# Patient Record
Sex: Female | Born: 2000
Health system: Southern US, Community
[De-identification: ages and names within clinical notes are randomized; demographics above are authoritative.]

## PROBLEM LIST (undated history)

## (undated) ENCOUNTER — Emergency Department (HOSPITAL_COMMUNITY): Admission: EM | Payer: Medicaid Other | Source: Home / Self Care

## (undated) DIAGNOSIS — T7840XA Allergy, unspecified, initial encounter: Secondary | ICD-10-CM

## (undated) DIAGNOSIS — D509 Iron deficiency anemia, unspecified: Secondary | ICD-10-CM

## (undated) DIAGNOSIS — O139 Gestational [pregnancy-induced] hypertension without significant proteinuria, unspecified trimester: Secondary | ICD-10-CM

## (undated) DIAGNOSIS — E669 Obesity, unspecified: Secondary | ICD-10-CM

## (undated) DIAGNOSIS — F909 Attention-deficit hyperactivity disorder, unspecified type: Secondary | ICD-10-CM

## (undated) DIAGNOSIS — J45909 Unspecified asthma, uncomplicated: Secondary | ICD-10-CM

## (undated) HISTORY — PX: OTHER SURGICAL HISTORY: SHX169

## (undated) HISTORY — PX: WISDOM TOOTH EXTRACTION: SHX21

## (undated) HISTORY — PX: HERNIA REPAIR: SHX51

## (undated) HISTORY — DX: Gestational (pregnancy-induced) hypertension without significant proteinuria, unspecified trimester: O13.9

## (undated) HISTORY — DX: Attention-deficit hyperactivity disorder, unspecified type: F90.9

## (undated) HISTORY — DX: Allergy, unspecified, initial encounter: T78.40XA

## (undated) HISTORY — DX: Unspecified asthma, uncomplicated: J45.909

## (undated) HISTORY — DX: Obesity, unspecified: E66.9

## (undated) HISTORY — DX: Iron deficiency anemia, unspecified: D50.9

---
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
## 2019-01-04 ENCOUNTER — Other Ambulatory Visit: Payer: Self-pay | Admitting: Pediatrics

## 2019-01-08 ENCOUNTER — Telehealth: Payer: Self-pay | Admitting: Pediatrics

## 2019-01-08 NOTE — Telephone Encounter (Signed)
Left VM at the primary number regarding prescreening questions. °

## 2019-01-09 ENCOUNTER — Ambulatory Visit (INDEPENDENT_AMBULATORY_CARE_PROVIDER_SITE_OTHER): Payer: Medicaid Other | Admitting: Student in an Organized Health Care Education/Training Program

## 2019-01-09 ENCOUNTER — Ambulatory Visit (INDEPENDENT_AMBULATORY_CARE_PROVIDER_SITE_OTHER): Payer: Medicaid Other | Admitting: Licensed Clinical Social Worker

## 2019-01-09 ENCOUNTER — Encounter: Payer: Self-pay | Admitting: Student in an Organized Health Care Education/Training Program

## 2019-01-09 ENCOUNTER — Other Ambulatory Visit: Payer: Self-pay

## 2019-01-09 VITALS — BP 108/66 | HR 77 | Ht 65.0 in | Wt 186.4 lb

## 2019-01-09 DIAGNOSIS — L700 Acne vulgaris: Secondary | ICD-10-CM

## 2019-01-09 DIAGNOSIS — Z3201 Encounter for pregnancy test, result positive: Secondary | ICD-10-CM | POA: Diagnosis not present

## 2019-01-09 DIAGNOSIS — Z68.41 Body mass index (BMI) pediatric, greater than or equal to 95th percentile for age: Secondary | ICD-10-CM

## 2019-01-09 DIAGNOSIS — F4322 Adjustment disorder with anxiety: Secondary | ICD-10-CM

## 2019-01-09 DIAGNOSIS — Z0001 Encounter for general adult medical examination with abnormal findings: Secondary | ICD-10-CM

## 2019-01-09 LAB — POCT URINE PREGNANCY: Preg Test, Ur: POSITIVE — AB

## 2019-01-09 MED ORDER — PRENATAL VITAMIN 27-0.8 MG PO TABS: 1.0000 | ORAL_TABLET | Freq: Every day | ORAL | 9 refills | Status: DC

## 2019-01-09 NOTE — Patient Instructions (Addendum)
-  [facility] [phone])  [email or web address], look for maternity care  Acne Plan A gentle cleansing routine and regular use of medications from your doctor can control your acne.   Products:  Face Wash:   -Use a gentle cleanser, such as Cetaphil (generic version of this is fine) -Use an acne face wash that contains Benzoyl Peroxide 2 times a day every day to wash your face. It will likely stain your towel and pillowcase, so use the same towel every time and try to use the same pillowcase.  (Benzoyl Peroxide - start at 2.5%) . Use a non-comedogenic (non-pore clogging) gentle moisturizing wash or cream. o Examples include: Cetaphil gentle skin cleansers (or the Cetaphil skin cleansing cloths), Purpose gentel cleansing wash, Free & Clear cleanser, Cerave hydrating or foaming cleanser, Neutrogena ultra gentle daily cleanser If you have oily skin on your face or acne on your chest or back: . Benzoyl peroxide (5.6-25%) or salicylic acid (6.3-8%) washes are recommended (once daily). . If you experience dryness with these washes, use them every other day. . Benzoyl peroxide may be bleach towels, sheets, and clothing.  If your doctor recommended a Benzoyl peroxide face wash, here are suggestions:  PanOxyl Benzoyl Peroxide Acne Creamy Wash (4%)  PanOxyl Acne Cleansing Bar 10% Benzoyl Peroxide  The brand name is not important, but look for "benzoyl peroxide" on the active ingredient list.  Moisturizer:   -Use an "oil-free" moisturizer with SPF  Apply a non-comedogenic face lotion after washing. o Cetaphil DermaControl Oil Control Moisturizer SPF 30, Cerave, Neutrogena, Aveeno, Vanicream o  Remember: - Your acne will probably get worse before it gets better - It takes at least 2 months for the medicines to start working - Use oil free soaps, lotions, make up; these can be over the counter or store-brand - Don't use harsh scrubs or astringents, these  can make skin irritation and acne worse - Moisturize daily with oil free lotion because the acne medicines will dry your skin - Some hair products (shampoo, conditioner, hair spray, hair gel, pomade) may clog pores if they are left on the skin. This may cause acne on the forehead or sides of the neck.   - Rinse shampoo & condition thoroughly from your scalp.  - Remove gel or pomade from your face and keep your hair (including bangs) off your face.

## 2019-01-09 NOTE — BH Specialist Note (Signed)
Integrated Behavioral Health Initial Visit  MRN: [id number] Name: Raven Medina  Number of [place] Clinician visits:: 1/6 Session Start time: 4:35  Session End time: 4:45 Total time: 10 mins, no charge due to brief visit  Type of Service: [place] Interpretor:No. Interpretor Name and Language: n/a   Warm Hand Off Completed.       SUBJECTIVE: Raven Medina is a 18 y.o. female accompanied by Sibling Patient was referred by Dr. Truman Hayward for coping skills. Patient reports the following symptoms/concerns: Pt reports she is adjusting to idea of adult life in the middle of a pandemic. Pt reports feeling both stressed and successful. Pt recently started new job and has financial and personal goals for herself that she would like to work toward. Duration of problem: several months; Severity of problem: mild  OBJECTIVE: Mood: Anxious and Euthymic and Affect: Appropriate Risk of harm to self or others: No plan to harm self or others  LIFE CONTEXT: Family and Social: Lives at home now, is interested in moving into her own apartment in the future School/Work: recently started new job, likes job and feels supported by coworkers Self-Care: Pt is interested in exploring and setting goals Life Changes: recent graduation, Covid 42, started new job  GOALS ADDRESSED: Patient will: 1. Demonstrate ability to: Increase healthy adjustment to current life circumstances  INTERVENTIONS: Interventions utilized: Veterinary surgeon, Supportive Counseling and Psychoeducation and/or Health Education  Standardized Assessments completed: PHQ 9 Modified for Teens  ASSESSMENT: Patient currently experiencing stress and questions associated w/ start of adult life and responsibilities.   Patient may benefit from ongoing support from this clinic.  PLAN: 1. Follow up with behavioral health clinician on : 01/16/2019 2. Behavioral recommendations: Pt  will list 3 goals she has for herself at different lengths of time 3. Referral(s): [place] (In Clinic) 4. "From scale of 1-10, how likely are you to follow plan?": Pt voiced understanding and agreement  Adalberto Ill, [facility]

## 2019-01-09 NOTE — Progress Notes (Addendum)
Adolescent Well Care Visit Raven Medina is a 18 y.o. female who is here for well care.    PCP:  Ander Slade, NP   History was provided by the patient.  Confidentiality was discussed with the patient and, if applicable, with caregiver as well. Patient's personal or confidential phone number: [phone]  Current Issues: Current concerns include: Came to visit to get pregnancy and STD testing   Nutrition: Nutrition/Eating Behaviors: Eating at most two meals per day. No fruits or vegetables. [employer] (free meals where she works), spaghetti, chicken. Most cook at home.  Sugary drinks: 3 cans a day of soda, tries to drink water but doesn't like it Adequate calcium in diet?: 2% with cereal, cheese, fish Supplements/ Vitamins: no  Exercise/ Media: Play any Sports?/ Exercise: dancing, walk to store/friends house occasionally, standing at work Screen Time:  > 2 hours-counseling provided- mostly phone and two hours of TV Media Rules or Monitoring?: no  Sleep:  Sleep: good, 12hours, somethimes snoring, no apnea  Social Screening: Lives with:  Aunt, uncle, sister, cousin Parental relations:  good Activities, Work, and Research officer, political party?: clean room, wash clothes, cooks sometimes  Concerns regarding behavior with peers?  no Stressors of note: no, Media planner at work  Education: School Name: [employer] Grade: graduation in a few months School performance:  All A's School Behavior: doing well; no concerns  Menstruation:   Patient's last menstrual period was 12/09/2018 (within days). Menstrual History: normal period     Obesity-related ROS: NEURO: Headaches: no ENT: snoring: yes Pulm: shortness of breath: no ABD: abdominal pain: no GU: polyuria, polydipsia: yes (more frequently lately) MSK: joint pains: no   Confidential Social History: Tobacco?  Yes, cigars w/ marijuana, she states she is "not proud of it". Helps relieve stress and helps her relax  Secondhand  smoke exposure?  yes Drugs/ETOH?  no  Sexually Active?  Yes, attractive to men, one partner, never condom (not trying to become pregnancy but wouldn't be upset)  Pregnancy Prevention: none, tried nexaplanon, not interested in learning about other options  Sharp pain in vaginal area has always been there No vaginal discharge, bleeding, dyuria, hematuria, no pain with intercourse  Safe at home, in school & in relationships?  Yes Safe to self?  Yes   Screenings: Patient has a dental home: yes Brush once a day   The patient completed the Rapid Assessment for Adolescent Preventive Services screening questionnaire and the following topics were identified as risk factors and discussed: healthy eating, exercise, weapon use, tobacco use, marijuana use, condom use, sexuality and screen time  In addition, the following topics were discussed as part of anticipatory guidance healthy eating, exercise, abuse/trauma, weapon use, tobacco use, marijuana use, condom use, birth control and sexuality.  PHQ-9 completed and results indicated score of 3 no concern for depression  Physical Exam:  Vitals:   01/09/19 1538  BP: 108/66  Pulse: 77  SpO2: 98%  Weight: 186 lb 6.4 oz (84.6 kg)  Height: 5\' 5"  (1.651 m)   BP 108/66 (BP Location: Right Arm, Patient Position: Sitting, Cuff Size: Normal)   Pulse 77   Ht 5\' 5"  (1.651 m)   Wt 186 lb 6.4 oz (84.6 kg)   LMP 12/09/2018 (Within Days)   SpO2 98%   BMI 31.02 kg/m  Body mass index: body mass index is 31.02 kg/m.    Hearing Screening   Method: Audiometry   125Hz  250Hz  500Hz  1000Hz  2000Hz  3000Hz  4000Hz  6000Hz  8000Hz   Right ear:   40  40 20  20    Left ear:   25 25 20  20       Visual Acuity Screening   Right eye Left eye Both eyes  Without correction: 20/20 20/20 20/20   With correction:       General: Alert, well-appearing female in NAD.  HEENT:   Head: Normocephalic, No signs of head trauma  Eyes: PERRL. EOM intact. Sclerae are  anicteric  Ears: TMs clear bilaterally with normal light reflex and landmarks visualized, no erythema  Nose: no nasal drainage  Throat: Good dentition, Moist mucous membranes.Oropharynx clear with no erythema or exudate Neck: normal range of motion, no lymphadenopathy, no thyromegaly Cardiovascular: Regular rate and rhythm, S1 and S2 normal. No murmur, rub, or gallop appreciated. Radial pulse +2 bilaterally Pulmonary: Normal work of breathing. Clear to auscultation bilaterally with no wheezes or crackles present, Cap refill <2 secs in UE/LE  Abdomen: Normoactive bowel sounds. Soft, non-tender, non-distended. No masses, no HSM.  Extremities: Warm and well-perfused, without cyanosis or edema. Full ROM Skin: hyperpigmented macules and papules present on face c/w acne vulgaris no erythema present  Assessment and Plan:   1. Encounter for general adult medical examination with abnormal findings Hearing screening result:normal Vision screening result: normal - Amb ref to [facility]  -Positive RAAPS - C. trachomatis/N. gonorrhoeae RNA - RPR - HIV antibody (with reflex) - POCT urine pregnancy  2. Body mass index, pediatric, greater than or equal to 95th percentile for age BMI is not appropriate for age  Counseled regarding 5-2-1-0 goals of healthy active living including:  - eating at least 5 fruits and vegetables a day - at least 1 hour of activity - no sugary beverages - eating three meals each day with age-appropriate servings - age-appropriate screen time - age-appropriate sleep patterns   -Patient will attempt to decrease amount of sugary drinks and try to incorporate healthier options at [employer]  -Will come to clinic tomorrow to get labs - Comprehensive metabolic panel - TSH - T4, free - VITAMIN D 25 Hydroxy (Vit-D Deficiency, Fractures) - Hemoglobin A1c - HDL cholesterol - Cholesterol, total  3. Positive pregnancy test Patient desires to continue  pregnancy. [facility] for her to establish pre natal care -Instructed to start pre-natal vitamin -Discussed the importance of abstinence from marijuana and alcohol  - [facility]   -Appointment with behavioral health tomorrow when she gets labs - Prenatal Vit-Fe Fumarate-FA (PRENATAL VITAMIN) 27-0.8 MG TABS; Take 1 tablet by mouth daily.  Dispense: 30 tablet; Refill: 9  4. Acne -Discussed washing face with non comedone face wash twice a day -Recommended using a non comedone moisturizer with SPF - We discussed the importance of doing this routine every single day to treat acne -Patient with positive pregnancy test, most likely cannot be on any retinoid treatment   Counseling provided for all of the vaccine components  Orders Placed This Encounter  Procedures  . C. trachomatis/N. gonorrhoeae RNA  . RPR  . Comprehensive metabolic panel  . TSH  . T4, free  . VITAMIN D 25 Hydroxy (Vit-D Deficiency, Fractures)  . Hemoglobin A1c  . HDL cholesterol  . Cholesterol, total  . HIV antibody (with reflex)  . [facility]  . POCT urine pregnancy     Return in about 2 weeks (around 01/23/2019) for Acne follow up .Marland Kitchen.  Janalyn HarderAmalia I Lee, MD

## 2019-01-10 ENCOUNTER — Encounter: Payer: Self-pay | Admitting: Pediatrics

## 2019-01-10 ENCOUNTER — Other Ambulatory Visit: Payer: Medicaid Other

## 2019-01-11 DIAGNOSIS — L700 Acne vulgaris: Secondary | ICD-10-CM | POA: Insufficient documentation

## 2019-01-11 LAB — COMPREHENSIVE METABOLIC PANEL
AG Ratio: 1.7 (calc) (ref 1.0–2.5)
ALT: 6 U/L (ref 5–32)
AST: 14 U/L (ref 12–32)
Albumin: 4 g/dL (ref 3.6–5.1)
Alkaline phosphatase (APISO): 55 U/L (ref 36–128)
BUN: 8 mg/dL (ref 7–20)
CO2: 23 mmol/L (ref 20–32)
Calcium: 9.2 mg/dL (ref 8.9–10.4)
Chloride: 107 mmol/L (ref 98–110)
Creat: 0.84 mg/dL (ref 0.50–1.00)
Globulin: 2.4 g/dL (calc) (ref 2.0–3.8)
Glucose, Bld: 99 mg/dL (ref 65–99)
Potassium: 3.5 mmol/L — ABNORMAL LOW (ref 3.8–5.1)
Sodium: 139 mmol/L (ref 135–146)
Total Bilirubin: 0.2 mg/dL (ref 0.2–1.1)
Total Protein: 6.4 g/dL (ref 6.3–8.2)

## 2019-01-11 LAB — C. TRACHOMATIS/N. GONORRHOEAE RNA
C. trachomatis RNA, TMA: NOT DETECTED
N. gonorrhoeae RNA, TMA: NOT DETECTED

## 2019-01-11 LAB — RPR: RPR Ser Ql: NONREACTIVE

## 2019-01-11 LAB — HEMOGLOBIN A1C
Hgb A1c MFr Bld: 5 % of total Hgb (ref <5.7)
Mean Plasma Glucose: 97 (calc)
eAG (mmol/L): 5.4 (calc)

## 2019-01-11 LAB — VITAMIN D 25 HYDROXY (VIT D DEFICIENCY, FRACTURES): Vit D, 25-Hydroxy: 27 ng/mL — ABNORMAL LOW (ref 30–100)

## 2019-01-11 LAB — HDL CHOLESTEROL: HDL: 43 mg/dL — ABNORMAL LOW (ref >45)

## 2019-01-11 LAB — T4, FREE: Free T4: 1 ng/dL (ref 0.8–1.4)

## 2019-01-11 LAB — CHOLESTEROL, TOTAL: Cholesterol: 110 mg/dL (ref <170)

## 2019-01-11 LAB — HIV ANTIBODY (ROUTINE TESTING W REFLEX): HIV 1&2 Ab, 4th Generation: NONREACTIVE

## 2019-01-11 LAB — TSH: TSH: 0.55 mIU/L

## 2019-01-14 ENCOUNTER — Other Ambulatory Visit: Payer: Self-pay

## 2019-01-14 ENCOUNTER — Encounter (HOSPITAL_COMMUNITY): Payer: Self-pay | Admitting: *Deleted

## 2019-01-14 ENCOUNTER — Inpatient Hospital Stay (HOSPITAL_COMMUNITY): Payer: Medicaid Other

## 2019-01-14 ENCOUNTER — Ambulatory Visit (HOSPITAL_COMMUNITY)
Admission: EM | Admit: 2019-01-14 | Discharge: 2019-01-14 | Disposition: A | Discharge status: Home / Self Care | Payer: Medicaid Other

## 2019-01-14 ENCOUNTER — Inpatient Hospital Stay (HOSPITAL_COMMUNITY)
Admission: AD | Admit: 2019-01-14 | Discharge: 2019-01-14 | Disposition: A | Discharge status: Home / Self Care | Payer: Medicaid Other | Attending: Obstetrics and Gynecology | Admitting: Obstetrics and Gynecology

## 2019-01-14 DIAGNOSIS — Z3A01 Less than 8 weeks gestation of pregnancy: Secondary | ICD-10-CM | POA: Diagnosis not present

## 2019-01-14 DIAGNOSIS — Z7722 Contact with and (suspected) exposure to environmental tobacco smoke (acute) (chronic): Secondary | ICD-10-CM | POA: Insufficient documentation

## 2019-01-14 DIAGNOSIS — R109 Unspecified abdominal pain: Secondary | ICD-10-CM

## 2019-01-14 DIAGNOSIS — O26891 Other specified pregnancy related conditions, first trimester: Secondary | ICD-10-CM | POA: Insufficient documentation

## 2019-01-14 DIAGNOSIS — O26899 Other specified pregnancy related conditions, unspecified trimester: Secondary | ICD-10-CM

## 2019-01-14 LAB — COMPREHENSIVE METABOLIC PANEL
ALT: 12 U/L (ref 0–44)
AST: 19 U/L (ref 15–41)
Albumin: 3.9 g/dL (ref 3.5–5.0)
Alkaline Phosphatase: 60 U/L (ref 38–126)
Anion gap: 8 (ref 5–15)
BUN: 11 mg/dL (ref 6–20)
CO2: 23 mmol/L (ref 22–32)
Calcium: 9.3 mg/dL (ref 8.9–10.3)
Chloride: 104 mmol/L (ref 98–111)
Creatinine, Ser: 0.88 mg/dL (ref 0.44–1.00)
GFR calc Af Amer: >60 mL/min (ref >60)
GFR calc non Af Amer: >60 mL/min (ref >60)
Glucose, Bld: 86 mg/dL (ref 70–99)
Potassium: 4 mmol/L (ref 3.5–5.1)
Sodium: 135 mmol/L (ref 135–145)
Total Bilirubin: 0.2 mg/dL — ABNORMAL LOW (ref 0.3–1.2)
Total Protein: 6.6 g/dL (ref 6.5–8.1)

## 2019-01-14 LAB — CBC
HCT: 35.5 % — ABNORMAL LOW (ref 36.0–46.0)
Hemoglobin: 11.2 g/dL — ABNORMAL LOW (ref 12.0–15.0)
MCH: 25.7 pg — ABNORMAL LOW (ref 26.0–34.0)
MCHC: 31.5 g/dL (ref 30.0–36.0)
MCV: 81.4 fL (ref 80.0–100.0)
Platelets: 176 10*3/uL (ref 150–400)
RBC: 4.36 MIL/uL (ref 3.87–5.11)
RDW: 15.9 % — ABNORMAL HIGH (ref 11.5–15.5)
WBC: 7.5 10*3/uL (ref 4.0–10.5)
nRBC: 0 % (ref 0.0–0.2)

## 2019-01-14 LAB — URINALYSIS, ROUTINE W REFLEX MICROSCOPIC
Bilirubin Urine: NEGATIVE
Glucose, UA: NEGATIVE mg/dL
Hgb urine dipstick: NEGATIVE
Ketones, ur: NEGATIVE mg/dL
Leukocytes,Ua: NEGATIVE
Nitrite: NEGATIVE
Protein, ur: NEGATIVE mg/dL
Specific Gravity, Urine: 1.021 (ref 1.005–1.030)
pH: 5 (ref 5.0–8.0)

## 2019-01-14 LAB — WET PREP, GENITAL
Sperm: NONE SEEN
Trich, Wet Prep: NONE SEEN
Yeast Wet Prep HPF POC: NONE SEEN

## 2019-01-14 LAB — HCG, QUANTITATIVE, PREGNANCY: hCG, Beta Chain, Quant, S: 6458 m[IU]/mL — ABNORMAL HIGH (ref <5)

## 2019-01-14 NOTE — MAU Note (Signed)
Been having some pains in lower stomach. Not really crampy, less. Started a few days ago. "more so worse at night".

## 2019-01-14 NOTE — Discharge Instructions (Signed)
-someone will call for US appt in two weeks, then you can call [facility] to make new ob appt.    [facility]    [facility]       Phone: [phone]  [facility]   Phone: [phone]  [facility] [place]  Phone: [phone]  [facility]  Phone: [phone]  [facility] [place]  Phone: [phone]  [facility] at [facility]   Phone: [phone]  [facility]       Phone: [phone]  [facility]    Phone: [phone]   [facility]    Phone: [phone]  [facility]    Phone: [phone]  [facility]    Phone: [phone]  [facility]       Phone: [phone]   [facility]  Phone: [phone]  [facility]    Phone: [phone]  [facility]   Phone: [phone]  [facility]      Phone: [phone]  [facility]    Phone: [phone]     First Trimester of Pregnancy  The first trimester of pregnancy is from week 1 until the end of week 13 (months 1 through 3). During this time, your baby will begin to develop inside you. At 6-8 weeks, the eyes and face are formed, and the heartbeat can be seen on ultrasound. At the end of 12 weeks, all the baby's organs are formed. Prenatal care is all the medical care you receive before the birth of your baby. Make sure you get good prenatal care and follow all of your doctor's instructions. Follow these instructions at home: Medicines  Take over-the-counter and prescription medicines only as told by your doctor. Some medicines are safe and some medicines are not safe during pregnancy.  Take a  prenatal vitamin that contains at least 600 micrograms (mcg) of folic acid.  If you have trouble pooping (constipation), take medicine that will make your stool soft (stool softener) if your doctor approves. Eating and drinking   Eat regular, healthy meals.  Your doctor will tell you the amount of weight gain that is right for you.  Avoid raw meat and uncooked cheese.  If you feel sick to your stomach (nauseous) or throw up (vomit): ? Eat 4 or 5 small meals a day instead of 3 large meals. ? Try eating a few soda crackers. ? Drink liquids between meals instead of during meals.  To prevent constipation: ? Eat foods that are high in fiber, like fresh fruits and vegetables, whole grains, and beans. ? Drink enough fluids to keep your pee (urine) clear or pale yellow. Activity  Exercise only as told by your doctor. Stop exercising if you have cramps or pain in your lower belly (abdomen) or low back.  Do not exercise if it is too hot, too humid, or if you are in a place of great height (high altitude).  Try to avoid standing for long periods of time. Move your legs often if you must stand in one place for a long time.  Avoid heavy lifting.  Wear low-heeled shoes. Sit and stand up straight.  You can have sex unless your doctor tells you not to. Relieving pain and discomfort  Wear a good support bra if your breasts  are sore.  Take warm water baths (sitz baths) to soothe pain or discomfort caused by hemorrhoids. Use hemorrhoid cream if your doctor says it is okay.  Rest with your legs raised if you have leg cramps or low back pain.  If you have puffy, bulging veins (varicose veins) in your legs: ? Wear support hose or compression stockings as told by your doctor. ? Raise (elevate) your feet for 15 minutes, 3-4 times a day. ? Limit salt in your food. Prenatal care  Schedule your prenatal visits by the twelfth week of pregnancy.  Write down your questions. Take them to your  prenatal visits.  Keep all your prenatal visits as told by your doctor. This is important. Safety  Wear your seat belt at all times when driving.  Make a list of emergency phone numbers. The list should include numbers for family, friends, the hospital, and police and fire departments. General instructions  Ask your doctor for a referral to a local prenatal class. Begin classes no later than at the start of month 6 of your pregnancy.  Ask for help if you need counseling or if you need help with nutrition. Your doctor can give you advice or tell you where to go for help.  Do not use hot tubs, steam rooms, or saunas.  Do not douche or use tampons or scented sanitary pads.  Do not cross your legs for long periods of time.  Avoid all herbs and alcohol. Avoid drugs that are not approved by your doctor.  Do not use any tobacco products, including cigarettes, chewing tobacco, and electronic cigarettes. If you need help quitting, ask your doctor. You may get counseling or other support to help you quit.  Avoid cat litter boxes and soil used by cats. These carry germs that can cause birth defects in the baby and can cause a loss of your baby (miscarriage) or stillbirth.  Visit your dentist. At home, brush your teeth with a soft toothbrush. Be gentle when you floss. Contact a doctor if:  You are dizzy.  You have mild cramps or pressure in your lower belly.  You have a nagging pain in your belly area.  You continue to feel sick to your stomach, you throw up, or you have watery poop (diarrhea).  You have a bad smelling fluid coming from your vagina.  You have pain when you pee (urinate).  You have increased puffiness (swelling) in your face, hands, legs, or ankles. Get help right away if:  You have a fever.  You are leaking fluid from your vagina.  You have spotting or bleeding from your vagina.  You have very bad belly cramping or pain.  You gain or lose weight  rapidly.  You throw up blood. It may look like coffee grounds.  You are around people who have [place] measles, fifth disease, or chickenpox.  You have a very bad headache.  You have shortness of breath.  You have any kind of trauma, such as from a fall or a car accident. Summary  The first trimester of pregnancy is from week 1 until the end of week 13 (months 1 through 3).  To take care of yourself and your unborn baby, you will need to eat healthy meals, take medicines only if your doctor tells you to do so, and do activities that are safe for you and your baby.  Keep all follow-up visits as told by your doctor. This is important as your doctor will have to ensure  that your baby is healthy and growing well. This information is not intended to replace advice given to you by your health care provider. Make sure you discuss any questions you have with your health care provider. Document Released: 12/28/2007 Document Revised: 07/19/2016 Document Reviewed: 07/19/2016 Elsevier Interactive Patient Education  2019 [employer]

## 2019-01-14 NOTE — MAU Provider Note (Signed)
Patient Raven Medina is a 18 y.o. G1P0 At [redacted]w[redacted]d here with complaints of abdominal pain. She denies vaginal bleeding, dysuria, HA, nausea, vomiting, abnormal discharge or other ob-gyn complaints. She found out she was pregnant on the 17th at her doctor's office. She is excited.   History     CSN: [id number]  Arrival date and time: 01/14/19 1803   None     Chief Complaint  Patient presents with  . Abdominal Pain   Abdominal Pain This is a new problem. The current episode started yesterday. The problem occurs intermittently. The problem has been unchanged. The pain is located in the suprapubic region. The pain is at a severity of 3/10. The quality of the pain is cramping. Exacerbated by: standing up. Relieved by: rest.    OB History    Gravida  1   Para      Term      Preterm      AB      Living        SAB      TAB      Ectopic      Multiple      Live Births              Past Medical History:  Diagnosis Date  . ADHD (attention deficit hyperactivity disorder)   . Allergy    seasonal  . Asthma    intermittent  . Iron deficiency anemia 04/17/2014  . Obesity     Past Surgical History:  Procedure Laterality Date  . myringotomy with tubes Bilateral    at age 42    Family History  Problem Relation Age of Onset  . Hypertension Mother   . Hypertension Maternal Grandmother   . Hypertension Maternal Grandfather     Social History   Tobacco Use  . Smoking status: Passive Smoke Exposure - Never Smoker  . Smokeless tobacco: Never Used  . Tobacco comment: mother smokes inside home  Substance Use Topics  . Alcohol use: No    Alcohol/week: 0.0 standard drinks  . Drug use: Yes    Types: Marijuana    Comment: Last smoked 01/08/19    Allergies: No Known Allergies  Medications Prior to Admission  Medication Sig Dispense Refill Last Dose  . Prenatal Vit-Fe Fumarate-FA (PRENATAL VITAMIN) 27-0.8 MG TABS Take 1 tablet by mouth daily. 30 tablet 9 01/14/2019  at 0700    Review of Systems  Constitutional: Negative.   HENT: Negative.   Respiratory: Negative.   Cardiovascular: Negative.   Gastrointestinal: Positive for abdominal pain.  Genitourinary: Negative.   Musculoskeletal: Negative.   Psychiatric/Behavioral: Negative.    Physical Exam   Blood pressure 108/66, pulse 88, temperature 98.2 F (36.8 C), temperature source Oral, resp. rate 16, weight 83.6 kg, last menstrual period 12/09/2018, SpO2 99 %.  Physical Exam  Constitutional: She is oriented to person, place, and time. She appears well-developed and well-nourished.  HENT:  Head: Normocephalic.  Neck: Normal range of motion.  Respiratory: Effort normal.  GI: Soft.  Genitourinary:    Vagina normal.   Musculoskeletal: Normal range of motion.  Neurological: She is alert and oriented to person, place, and time.  Skin: Skin is warm and dry.  Psychiatric: She has a normal mood and affect.    MAU Course  Procedures  MDM -full ectopic work up performed; US shows gestational sac and yolk sac>ectopic pregnancy excluded.  -beta is 6,458.  -wet prep positive for clue cells but will defer due  to first trimester concerns -GC pending -UA negative for signs of infection.   Assessment and Plan   1. Abdominal pain affecting pregnancy   2. Abdominal pain during pregnancy in first trimester    2. Patient stable for discharge with US ordered for viability in two weeks.   3. Reviewed warning signs and when to return to MAU.   4. List of providers given.    Charlesetta GaribaldiKathryn Lorraine Kooistra 01/14/2019, 8:15 PM

## 2019-01-14 NOTE — ED Notes (Signed)
Pt presents as [redacted] wks pregnant with abd pain.  + Hcg noted in system from 01/09/19.  Discussed with H. [place], [place].  Explained to pt that because she is pregnant and is having abd pain, she needs to proceed to MAU.  Pt verbalized understanding, and verbalized understanding of new location.

## 2019-01-15 LAB — GC/CHLAMYDIA PROBE AMP (~~LOC~~) NOT AT ARMC
Chlamydia: NEGATIVE
Neisseria Gonorrhea: NEGATIVE

## 2019-01-15 LAB — ABO/RH: ABO/RH(D): O POS

## 2019-01-16 ENCOUNTER — Ambulatory Visit: Payer: Self-pay | Admitting: Licensed Clinical Social Worker

## 2019-01-22 ENCOUNTER — Other Ambulatory Visit: Payer: Self-pay | Admitting: Student

## 2019-01-22 ENCOUNTER — Telehealth: Payer: Self-pay | Admitting: Student

## 2019-01-22 DIAGNOSIS — O3680X0 Pregnancy with inconclusive fetal viability, not applicable or unspecified: Secondary | ICD-10-CM

## 2019-01-25 ENCOUNTER — Telehealth: Payer: Self-pay | Admitting: *Deleted

## 2019-01-25 NOTE — Telephone Encounter (Signed)
Pre-screening for onsite visit  1. Who is bringing the patient to the visit?   Informed only one adult can bring patient to the visit to limit possible exposure to COVID19 and facemasks must be worn while in the building by the patient (ages 2 and older) and adult.  2. Has the person bringing the patient or the patient been around anyone with suspected or confirmed COVID-19 in the last 14 days?    3. Has the person bringing the patient or the patient been around anyone who has been tested for COVID-19 in the last 14 days?   4. Has the person bringing the patient or the patient had any of these symptoms in the last 14 days?   Fever (temp 100 F or higher) Breathing problems Cough Sore throat Body aches Chills Vomiting Diarrhea   If all answers are negative, advise patient to call our office prior to your appointment if you or the patient develop any of the symptoms listed above.   If any answers are yes, cancel in-office visit and schedule the patient for a same day telehealth visit with a provider to discuss the next steps.  

## 2019-01-28 ENCOUNTER — Ambulatory Visit: Payer: Medicaid Other | Admitting: Student in an Organized Health Care Education/Training Program

## 2019-01-28 ENCOUNTER — Encounter: Payer: Self-pay | Admitting: Licensed Clinical Social Worker

## 2019-02-06 NOTE — Telephone Encounter (Signed)
Opened in error

## 2019-02-12 ENCOUNTER — Encounter: Payer: Self-pay | Admitting: Family Medicine

## 2019-02-12 ENCOUNTER — Telehealth: Payer: Self-pay | Admitting: Licensed Clinical Social Worker

## 2019-02-12 ENCOUNTER — Ambulatory Visit (HOSPITAL_COMMUNITY)
Admission: RE | Admit: 2019-02-12 | Discharge: 2019-02-12 | Disposition: A | Discharge status: Home / Self Care | Payer: Medicaid Other | Source: Ambulatory Visit | Attending: Student | Admitting: Student

## 2019-02-12 ENCOUNTER — Ambulatory Visit (INDEPENDENT_AMBULATORY_CARE_PROVIDER_SITE_OTHER): Payer: Medicaid Other

## 2019-02-12 ENCOUNTER — Other Ambulatory Visit: Payer: Self-pay

## 2019-02-12 DIAGNOSIS — O3680X0 Pregnancy with inconclusive fetal viability, not applicable or unspecified: Secondary | ICD-10-CM | POA: Diagnosis present

## 2019-02-12 DIAGNOSIS — Z712 Person consulting for explanation of examination or test findings: Secondary | ICD-10-CM

## 2019-02-12 NOTE — Progress Notes (Signed)
Pt here today for pregnancy [place] results.  Pt informed that she has a viable pregnancy with FHR 172 bpm, EDD 09/13/19, and that she is 9w 4d today.  Medications reconciled.  Ray office to provide proof of pregnancy letter to start prenatal care.    Mel Almond, RN 02/12/19

## 2019-02-12 NOTE — Telephone Encounter (Signed)
Patient confirmed appt.

## 2019-02-18 NOTE — Progress Notes (Signed)
Education given regarding options for contraception, including Larc's. Raven Medina [facility] Patient is reviewing birth control options

## 2019-02-18 NOTE — Progress Notes (Signed)
Patient seen and assessed by nursing staff during this encounter. I have reviewed the chart and agree with the documentation and plan.  Kerry Hough, PA-C 02/18/2019 2:11 PM

## 2019-02-19 ENCOUNTER — Other Ambulatory Visit: Payer: Self-pay

## 2019-02-19 ENCOUNTER — Ambulatory Visit (INDEPENDENT_AMBULATORY_CARE_PROVIDER_SITE_OTHER): Payer: Medicaid Other | Admitting: *Deleted

## 2019-02-19 DIAGNOSIS — Z712 Person consulting for explanation of examination or test findings: Secondary | ICD-10-CM

## 2019-02-19 DIAGNOSIS — Z3400 Encounter for supervision of normal first pregnancy, unspecified trimester: Secondary | ICD-10-CM

## 2019-02-19 HISTORY — DX: Encounter for supervision of normal first pregnancy, unspecified trimester: Z34.00

## 2019-02-19 NOTE — Progress Notes (Signed)
  Virtual Visit via Telephone Note  I connected with Raven Medina on 02/19/19 at 10:30 AM EDT by telephone and verified that I am speaking with the correct person using two identifiers.  Location: Patient: Raven Medina MRN: [id number] Provider: Derl Barrow, RN   I discussed the limitations, risks, security and privacy concerns of performing an evaluation and management service by telephone and the availability of in person appointments. I also discussed with the patient that there may be a patient responsible charge related to this service. The patient expressed understanding and agreed to proceed.   History of Present Illness: PRENATAL INTAKE SUMMARY  Raven Medina presents today New OB Nurse Interview.  OB History    Gravida  1   Para      Term      Preterm      AB      Living        SAB      TAB      Ectopic      Multiple      Live Births             I have reviewed the patient's medical, obstetrical, social, and family histories, medications, and available lab results.  SUBJECTIVE She has no unusual complaints   Observations/Objective: Initial nurse interview for history/labs (New OB)  EDD: 09/13/2019 by ultrasound GA: [redacted]w[redacted]d G1P0 FHT: non face to face interview  GENERAL APPEARANCE: non face to face interview  Assessment and Plan: Normal pregnancy Prenatal care- [facility] Continue PNV Labs and physical to be completed at next visit with midwife  Follow Up Instructions:   I discussed the assessment and treatment plan with the patient. The patient was provided an opportunity to ask questions and all were answered. The patient agreed with the plan and demonstrated an understanding of the instructions.   The patient was advised to call back or seek an in-person evaluation if the symptoms worsen or if the condition fails to improve as anticipated.  I provided 15 minutes of non-face-to-face time during this encounter.   Derl Barrow, RN

## 2019-02-19 NOTE — Patient Instructions (Signed)
First Trimester of Pregnancy  The first trimester of pregnancy is from week 1 until the end of week 13 (months 1 through 3). During this time, your baby will begin to develop inside you. At 6-8 weeks, the eyes and face are formed, and the heartbeat can be seen on ultrasound. At the end of 12 weeks, all the baby's organs are formed. Prenatal care is all the medical care you receive before the birth of your baby. Make sure you get good prenatal care and follow all of your doctor's instructions. Follow these instructions at home: Medicines  Take over-the-counter and prescription medicines only as told by your doctor. Some medicines are safe and some medicines are not safe during pregnancy.  Take a prenatal vitamin that contains at least 600 micrograms (mcg) of folic acid.  If you have trouble pooping (constipation), take medicine that will make your stool soft (stool softener) if your doctor approves. Eating and drinking   Eat regular, healthy meals.  Your doctor will tell you the amount of weight gain that is right for you.  Avoid raw meat and uncooked cheese.  If you feel sick to your stomach (nauseous) or throw up (vomit): ? Eat 4 or 5 small meals a day instead of 3 large meals. ? Try eating a few soda crackers. ? Drink liquids between meals instead of during meals.  To prevent constipation: ? Eat foods that are high in fiber, like fresh fruits and vegetables, whole grains, and beans. ? Drink enough fluids to keep your pee (urine) clear or pale yellow. Activity  Exercise only as told by your doctor. Stop exercising if you have cramps or pain in your lower belly (abdomen) or low back.  Do not exercise if it is too hot, too humid, or if you are in a place of great height (high altitude).  Try to avoid standing for long periods of time. Move your legs often if you must stand in one place for a long time.  Avoid heavy lifting.  Wear low-heeled shoes. Sit and stand up straight.   You can have sex unless your doctor tells you not to. Relieving pain and discomfort  Wear a good support bra if your breasts are sore.  Take warm water baths (sitz baths) to soothe pain or discomfort caused by hemorrhoids. Use hemorrhoid cream if your doctor says it is okay.  Rest with your legs raised if you have leg cramps or low back pain.  If you have puffy, bulging veins (varicose veins) in your legs: ? Wear support hose or compression stockings as told by your doctor. ? Raise (elevate) your feet for 15 minutes, 3-4 times a day. ? Limit salt in your food. Prenatal care  Schedule your prenatal visits by the twelfth week of pregnancy.  Write down your questions. Take them to your prenatal visits.  Keep all your prenatal visits as told by your doctor. This is important. Safety  Wear your seat belt at all times when driving.  Make a list of emergency phone numbers. The list should include numbers for family, friends, the hospital, and police and fire departments. General instructions  Ask your doctor for a referral to a local prenatal class. Begin classes no later than at the start of month 6 of your pregnancy.  Ask for help if you need counseling or if you need help with nutrition. Your doctor can give you advice or tell you where to go for help.  Do not use hot tubs, steam   rooms, or saunas.  Do not douche or use tampons or scented sanitary pads.  Do not cross your legs for long periods of time.  Avoid all herbs and alcohol. Avoid drugs that are not approved by your doctor.  Do not use any tobacco products, including cigarettes, chewing tobacco, and electronic cigarettes. If you need help quitting, ask your doctor. You may get counseling or other support to help you quit.  Avoid cat litter boxes and soil used by cats. These carry germs that can cause birth defects in the baby and can cause a loss of your baby (miscarriage) or stillbirth.  Visit your dentist. At home,  brush your teeth with a soft toothbrush. Be gentle when you floss. Contact a doctor if:  You are dizzy.  You have mild cramps or pressure in your lower belly.  You have a nagging pain in your belly area.  You continue to feel sick to your stomach, you throw up, or you have watery poop (diarrhea).  You have a bad smelling fluid coming from your vagina.  You have pain when you pee (urinate).  You have increased puffiness (swelling) in your face, hands, legs, or ankles. Get help right away if:  You have a fever.  You are leaking fluid from your vagina.  You have spotting or bleeding from your vagina.  You have very bad belly cramping or pain.  You gain or lose weight rapidly.  You throw up blood. It may look like coffee grounds.  You are around people who have [place] measles, fifth disease, or chickenpox.  You have a very bad headache.  You have shortness of breath.  You have any kind of trauma, such as from a fall or a car accident. Summary  The first trimester of pregnancy is from week 1 until the end of week 13 (months 1 through 3).  To take care of yourself and your unborn baby, you will need to eat healthy meals, take medicines only if your doctor tells you to do so, and do activities that are safe for you and your baby.  Keep all follow-up visits as told by your doctor. This is important as your doctor will have to ensure that your baby is healthy and growing well. This information is not intended to replace advice given to you by your health care provider. Make sure you discuss any questions you have with your health care provider. Document Released: 12/28/2007 Document Revised: 11/01/2018 Document Reviewed: 07/19/2016 Elsevier Patient Education  2020 [employer]  How a Baby Grows During Pregnancy  Pregnancy begins when a female's sperm enters a female's egg (fertilization). Fertilization usually happens in one of the tubes (fallopian tubes) that connect the  ovaries to the womb (uterus). The fertilized egg moves down the fallopian tube to the uterus. Once it reaches the uterus, it implants into the lining of the uterus and begins to grow. For the first 10 weeks, the fertilized egg is called an embryo. After 10 weeks, it is called a fetus. As the fetus continues to grow, it receives oxygen and nutrients through tissue (placenta) that grows to support the developing baby. The placenta is the life support system for the baby. It provides oxygen and nutrition and removes waste. Learning as much as you can about your pregnancy and how your baby is developing can help you enjoy the experience. It can also make you aware of when there might be a problem and when to ask questions. How long does a  typical pregnancy last? A pregnancy usually lasts 280 days, or about 40 weeks. Pregnancy is divided into three periods of growth, also called trimesters:  First trimester: 0-12 weeks.  Second trimester: 13-27 weeks.  Third trimester: 28-40 weeks. The day when your baby is ready to be born (full term) is your estimated date of delivery. How does my baby develop month by month? First month  The fertilized egg attaches to the inside of the uterus.  Some cells will form the placenta. Others will form the fetus.  The arms, legs, brain, spinal cord, lungs, and heart begin to develop.  At the end of the first month, the heart begins to beat. Second month  The bones, inner ear, eyelids, hands, and feet form.  The genitals develop.  By the end of 8 weeks, all major organs are developing. Third month  All of the internal organs are forming.  Teeth develop below the gums.  Bones and muscles begin to grow. The spine can flex.  The skin is transparent.  Fingernails and toenails begin to form.  Arms and legs continue to grow longer, and hands and feet develop.  The fetus is about 3 inches (7.6 cm) long. Fourth month  The placenta is completely formed.   The external sex organs, neck, outer ear, eyebrows, eyelids, and fingernails are formed.  The fetus can hear, swallow, and move its arms and legs.  The kidneys begin to produce urine.  The skin is covered with a white, waxy coating (vernix) and very fine hair (lanugo). Fifth month  The fetus moves around more and can be felt for the first time (quickening).  The fetus starts to sleep and wake up and may begin to suck its finger.  The nails grow to the end of the fingers.  The organ in the digestive system that makes bile (gallbladder) functions and helps to digest nutrients.  If your baby is a girl, eggs are present in her ovaries. If your baby is a boy, testicles start to move down into his scrotum. Sixth month  The lungs are formed.  The eyes open. The brain continues to develop.  Your baby has fingerprints and toe prints. Your baby's hair grows thicker.  At the end of the second trimester, the fetus is about 9 inches (22.9 cm) long. Seventh month  The fetus kicks and stretches.  The eyes are developed enough to sense changes in light.  The hands can make a grasping motion.  The fetus responds to sound. Eighth month  All organs and body systems are fully developed and functioning.  Bones harden, and taste buds develop. The fetus may hiccup.  Certain areas of the brain are still developing. The skull remains soft. Ninth month  The fetus gains about  lb (0.23 kg) each week.  The lungs are fully developed.  Patterns of sleep develop.  The fetus's head typically moves into a head-down position (vertex) in the uterus to prepare for birth.  The fetus weighs 6-9 lb (2.72-4.08 kg) and is 19-20 inches (48.26-50.8 cm) long. What can I do to have a healthy pregnancy and help my baby develop? General instructions  Take prenatal vitamins as directed by your health care provider. These include vitamins such as folic acid, iron, calcium, and vitamin D. They are important  for healthy development.  Take medicines only as directed by your health care provider. Read labels and ask a pharmacist or your health care provider whether over-the-counter medicines, supplements, and prescription drugs are  safe to take during pregnancy.  Keep all follow-up visits as directed by your health care provider. This is important. Follow-up visits include prenatal care and screening tests. How do I know if my baby is developing well? At each prenatal visit, your health care provider will do several different tests to check on your health and keep track of your baby's development. These include:  Fundal height and position. ? Your health care provider will measure your growing belly from your pubic bone to the top of the uterus using a tape measure. ? Your health care provider will also feel your belly to determine your baby's position.  Heartbeat. ? An ultrasound in the first trimester can confirm pregnancy and show a heartbeat, depending on how far along you are. ? Your health care provider will check your baby's heart rate at every prenatal visit.  Second trimester ultrasound. ? This ultrasound checks your baby's development. It also may show your baby's gender. What should I do if I have concerns about my baby's development? Always talk with your health care provider about any concerns that you may have about your pregnancy and your baby. Summary  A pregnancy usually lasts 280 days, or about 40 weeks. Pregnancy is divided into three periods of growth, also called trimesters.  Your health care provider will monitor your baby's growth and development throughout your pregnancy.  Follow your health care provider's recommendations about taking prenatal vitamins and medicines during your pregnancy.  Talk with your health care provider if you have any concerns about your pregnancy or your developing baby. This information is not intended to replace advice given to you by your  health care provider. Make sure you discuss any questions you have with your health care provider. Document Released: 12/28/2007 Document Revised: 11/01/2018 Document Reviewed: 05/24/2017 Elsevier Patient Education  2020 [employer] Inc.  Breast Pumping Tips Breast pumping is a way to get milk out of your breasts. You will then store the milk for your baby to use when you are away from home. There are three ways to pump. You can:  Use your hand to massage and squeeze your breast (hand expression).  Use a hand-held machine to manually pump your milk.  Use an electric machine to pump your milk. In the beginning you may not get much milk. After a few days your breasts should make more. Pumping can help you start making milk after your baby is born. Pumping helps you to keep making milk when you are away from your baby. When should I pump? You can start pumping soon after your baby is born. Follow these tips:  When you are with your baby: ? Pump after you breastfeed. ? Pump from the free breast while you breastfeed.  When you are away from your baby: ? Pump every 2-3 hours for 15 minutes. ? Pump both breasts at the same time if you can.  If your baby drinks formula, pump around the time your baby gets the formula.  If you drank alcohol, wait 2 hours before you pump.  If you are going to have surgery, ask your doctor when you should pump again. How do I get ready to pump? Take steps to relax. Try these things to help your milk come in:  Smell your baby's blanket or clothes.  Look at a picture or video of your baby.  Sit in a quiet, private space.  Massage your breast and nipple.  Place a cloth on your breast. The cloth should be  warm and a little wet.  Play relaxing music.  Picture your milk flowing. What are some tips? General tips for pumping breast milk   Always wash your hands before pumping.  If you do not get much milk or if pumping hurts, try different pump settings  or a different kind of pump.  Drink enough fluid so your pee (urine) is clear or pale yellow.  Wear clothing that opens in the front or is easy to take off.  Pump milk into a clean bottle or container.  Do not use anything that has nicotine or tobacco. Examples are cigarettes and e-cigarettes. If you need help quitting, ask your doctor. Tips for storing breast milk   Store breast milk in a clean, BPA-free container. These include: ? A glass or plastic bottle. ? A milk storage bag.  Store only 2-4 ounces of breast milk in each container.  Swirl the breast milk in the container. Do not shake it.  Write down the date you pumped the milk on the container.  This is how long you can store breast milk: ? Room temperature: 6-8 hours. It is best to use the milk within 4 hours. ? Cooler with ice packs: 24 hours. ? Refrigerator: 5-8 days, if the milk is clean. It is best to use the milk within 3 days. ? Freezer: 9-12 months, if the milk is clean and stored away from the freezer door. It is best to use the milk within 6 months.  Put milk in the back of the refrigerator or freezer.  Thaw frozen milk using warm water. Do not use the microwave. Tips for choosing a breast pump When choosing a pump, keep the following things in mind:  Manual breast pumps do not need electricity. They cost less. They can be hard to use.  Electric breast pumps use electricity. They are more expensive. They are easier to use. They collect more milk.  The suction cup (flange) should be the right size.  Before you buy the pump, check if your insurance will pay for it. Tips for caring for a breast pump  Check the manual that came with your pump for cleaning tips.  Clean the pump after you use it. To do this: 1. Wipe down the electrical part. Use a dry cloth or paper towel. Do not put this part in water or in cleaning products. 2. Wash the plastic parts with soap and warm water. Or use the dishwasher if the  manual says it is safe. You do not need to clean the tubing unless it touched breast milk. 3. Let all the parts air dry. Avoid drying them with a cloth or towel. 4. When the parts are clean and dry, put the pump back together. Then store the pump.  If there is water in the tubing when you want to pump: 1. Attach the tubing to the pump. 2. Turn on the pump. 3. Turn off the pump when the tube is dry.  Try not to touch the inside of pump parts. Summary  Pumping can help you start making milk after your baby is born. It lets you keep making milk when you are away from your baby.  When you are away from your baby, pump for about 15 minutes every 2-3 hours. Pump both breasts at the same time, if you can. This information is not intended to replace advice given to you by your health care provider. Make sure you discuss any questions you have with your health care  provider. Document Released: 12/28/2007 Document Revised: 10/31/2018 Document Reviewed: 08/15/2016 Elsevier Patient Education  2020 [employer].  Warning Signs During Pregnancy A pregnancy lasts about 40 weeks, starting from the first day of your last period until the baby is born. Pregnancy is divided into three phases called trimesters.  The first trimester refers to week 1 through week 13 of pregnancy.  The second trimester is the start of week 14 through the end of week 27.  The third trimester is the start of week 28 until you deliver your baby. During each trimester of pregnancy, certain signs and symptoms may indicate a problem. Talk with your health care provider about your current health and any medical conditions you have. Make sure you know the symptoms that you should watch for and report. How does this affect me?  Warning signs in the first trimester While some changes during the first trimester may be uncomfortable, most do not represent a serious problem. Let your health care provider know if you have any of the  following warning signs in the first trimester:  You cannot eat or drink without vomiting, and this lasts for longer than a day.  You have vaginal bleeding or spotting along with menstrual-like cramping.  You have diarrhea for longer than a day.  You have a fever or other signs of infection, such as: ? Pain or burning when you urinate. ? Foul smelling or thick or yellowish vaginal discharge. Warning signs in the second trimester As your baby grows and changes during the second trimester, there are additional signs and symptoms that may indicate a problem. These include:  Signs and symptoms of infection, including a fever.  Signs or symptoms of a miscarriage or preterm labor, such as regular contractions, menstrual-like cramping, or lower abdominal pain.  Bloody or watery vaginal discharge or obvious vaginal bleeding.  Feeling like your heart is pounding.  Having trouble breathing.  Nausea, vomiting, or diarrhea that lasts for longer than a day.  Craving non-food items, such as clay, chalk, or dirt. This may be a sign of a very treatable medical condition called pica. Later in your second trimester, watch for signs and symptoms of a serious medical condition called preeclampsia.These include:  Changes in your vision.  A severe headache that does not go away.  Nausea and vomiting. It is also important to notice if your baby stops moving or moves less than usual during this time. Warning signs in the third trimester As you approach the third trimester, your baby is growing and your body is preparing for the birth of your baby. In your third trimester, be sure to let your health care provider know if:  You have signs and symptoms of infection, including a fever.  You have vaginal bleeding.  You notice that your baby is moving less than usual or is not moving.  You have nausea, vomiting, or diarrhea that lasts for longer than a day.  You have a severe headache that does not  go away.  You have vision changes, including seeing spots or having blurry or double vision.  You have increased swelling in your hands or face. How does this affect my baby? Throughout your pregnancy, always report any of the warning signs of a problem to your health care provider. This can help prevent complications that may affect your baby, including:  Increased risk for premature birth.  Infection that may be transmitted to your baby.  Increased risk for stillbirth. Contact a health care provider  if:  You have any of the warning signs of a problem for the current trimester of your pregnancy.  Any of the following apply to you during any trimester of pregnancy: ? You have strong emotions, such as sadness or anxiety, that interfere with work or personal relationships. ? You feel unsafe in your home and need help finding a safe place to live. ? You are using tobacco products, alcohol, or drugs and you need help to stop. Get help right away if: You have signs or symptoms of labor before 37 weeks of pregnancy. These include:  Contractions that are 5 minutes or less apart, or that increase in frequency, intensity, or length.  Sudden, sharp abdominal pain or low back pain.  Uncontrolled gush or trickle of fluid from your vagina. Summary  A pregnancy lasts about 40 weeks, starting from the first day of your last period until the baby is born. Pregnancy is divided into three phases called trimesters. Each trimester has warning signs to watch for.  Always report any warning signs to your health care provider in order to prevent complications that may affect both you and your baby.  Talk with your health care provider about your current health and any medical conditions you have. Make sure you know the symptoms that you should watch for and report. This information is not intended to replace advice given to you by your health care provider. Make sure you discuss any questions you have  with your health care provider. Document Released: 04/27/2017 Document Revised: 10/30/2018 Document Reviewed: 04/27/2017 Elsevier Patient Education  2020 [employer].

## 2019-02-27 ENCOUNTER — Encounter: Payer: Self-pay | Admitting: General Practice

## 2019-02-27 ENCOUNTER — Other Ambulatory Visit: Payer: Self-pay

## 2019-02-27 ENCOUNTER — Encounter: Payer: Self-pay | Admitting: Medical

## 2019-02-27 ENCOUNTER — Ambulatory Visit (INDEPENDENT_AMBULATORY_CARE_PROVIDER_SITE_OTHER): Payer: Medicaid Other | Admitting: Medical

## 2019-02-27 ENCOUNTER — Other Ambulatory Visit (HOSPITAL_COMMUNITY)
Admission: RE | Admit: 2019-02-27 | Discharge: 2019-02-27 | Disposition: A | Discharge status: Home / Self Care | Payer: Medicaid Other | Source: Ambulatory Visit | Attending: Medical | Admitting: Medical

## 2019-02-27 VITALS — BP 119/78 | Wt 191.0 lb

## 2019-02-27 DIAGNOSIS — Z3400 Encounter for supervision of normal first pregnancy, unspecified trimester: Secondary | ICD-10-CM | POA: Insufficient documentation

## 2019-02-27 DIAGNOSIS — J45909 Unspecified asthma, uncomplicated: Secondary | ICD-10-CM | POA: Diagnosis not present

## 2019-02-27 DIAGNOSIS — Z3689 Encounter for other specified antenatal screening: Secondary | ICD-10-CM

## 2019-02-27 DIAGNOSIS — O99011 Anemia complicating pregnancy, first trimester: Secondary | ICD-10-CM | POA: Diagnosis not present

## 2019-02-27 DIAGNOSIS — O99511 Diseases of the respiratory system complicating pregnancy, first trimester: Secondary | ICD-10-CM

## 2019-02-27 DIAGNOSIS — Z3A11 11 weeks gestation of pregnancy: Secondary | ICD-10-CM | POA: Diagnosis not present

## 2019-02-27 NOTE — Patient Instructions (Signed)
Childbirth Education Options: [facility] Department Classes:  Childbirth education classes can help you get ready for a positive parenting experience. You can also meet other expectant parents and get free stuff for your baby. Each class runs for five weeks on the same night and costs $45 for the mother-to-be and her support person. Medicaid covers the cost if you are eligible. Call [phone] to register. [facility] Childbirth Education:  [phone] or [phone] or [email or web address]  Baby & Me Class: Discuss newborn & infant parenting and family adjustment issues with other new mothers in a relaxed environment. Each week brings a new speaker or baby-centered activity. We encourage new mothers to join [place] every Thursday at 11:00am. Babies birth until crawling. No registration or fee. Daddy [employer]: This course offers Dads-to-be the tools and knowledge needed to feel confident on their journey to becoming new fathers. Experienced dads, who have been trained as coaches, teach dads-to-be how to hold, comfort, diaper, swaddle and play with their infant while being able to support the new mom as well. A class for men taught by men. $25/dad Big Brother/Big Sister: Let your children share in the joy of a new brother or sister in this special class designed just for them. Class includes discussion about how families care for babies: swaddling, holding, diapering, safety as well as how they can be helpful in their new role. This class is designed for children ages 45 to 48, but any age is welcome. Please register each child individually. $5/child  Mom Talk: This mom-led group offers support and connection to mothers as they journey through the adjustments and struggles of that sometimes overwhelming first year after the birth of a child. Tuesdays at 10:00am and Thursdays at 6:00pm. Babies welcome. No registration or fee. Breastfeeding Support Group: This group is a mother-to-mother  support circle where moms have the opportunity to share their breastfeeding experiences. A Lactation Consultant is present for questions and concerns. Meets each Tuesday at 11:00am. No fee or registration. Breastfeeding Your Baby: Learn what to expect in the first days of breastfeeding your newborn.  This class will help you feel more confident with the skills needed to begin your breastfeeding experience. Many new mothers are concerned about breastfeeding after leaving the hospital. This class will also address the most common fears and challenges about breastfeeding during the first few weeks, months and beyond. (call for fee) Comfort Techniques and Tour: This 2 hour interactive class will provide you the opportunity to learn & practice hands-on techniques that can help relieve some of the discomfort of labor and encourage your baby to rotate toward the best position for birth. You and your partner will be able to try a variety of labor positions with birth balls and rebozos as well as practice breathing, relaxation, and visualization techniques. A tour of the [facility] is included with this class. $20 per registrant and support person Childbirth Class- Weekend Option: This class is a Weekend version of our Birth & Baby series. It is designed for parents who have a difficult time fitting several weeks of classes into their schedule. It covers the care of your newborn and the basics of labor and childbirth. It also includes a [place] of [facility] and lunch. The class is held two consecutive days: beginning on Friday evening from 6:30 - 8:30 p.m. and the next day, Saturday from 9 a.m. - 4 p.m. (call for fee) Doren Custard Class: Interested in a waterbirth?  This  informational class will help you discover whether waterbirth is the right fit for you. Education about waterbirth itself, supplies you would need and how to assemble your support team is what you can  expect from this class. Some obstetrical practices require this class in order to pursue a waterbirth. (Not all obstetrical practices offer waterbirth-check with your healthcare provider.) Register only the expectant mom, but you are encouraged to bring your partner to class! Required if planning waterbirth, no fee. Infant/Child CPR: Parents, grandparents, babysitters, and friends learn Cardio-Pulmonary Resuscitation skills for infants and children. You will also learn how to treat both conscious and unconscious choking in infants and children. This Family & Friends program does not offer certification. Register each participant individually to ensure that enough mannequins are available. (Call for fee) Grandparent Love: Expecting a grandbaby? This class is for you! Learn about the latest infant care and safety recommendations and ways to support your own child as he or she transitions into the parenting role. Taught by Registered Nurses who are childbirth instructors, but most importantly...they are grandmothers too! $10/person. Childbirth Class- Natural Childbirth: This series of 5 weekly classes is for expectant parents who want to learn and practice natural methods of coping with the process of labor and childbirth. Relaxation, breathing, massage, visualization, role of the partner, and helpful positioning are highlighted. Participants learn how to be confident in their body's ability to give birth. This class will empower and help parents make informed decisions about their own care. Includes discussion that will help new parents transition into the immediate postpartum period. Maternity Care Center Tour of [facility] is included. We suggest taking this class between 25-32 weeks, but it's only a recommendation. $75 per registrant and one support person or $30 Medicaid. Childbirth Class- 3 week Series: This option of 3 weekly classes helps you and your labor partner prepare for childbirth. Newborn  care, labor & birth, cesarean birth, pain management, and comfort techniques are discussed and a Maternity Care Center Tour of [facility] is included. The class meets at the same time, on the same day of the week for 3 consecutive weeks beginning with the starting date you choose. $60 for registrant and one support person.  Marvelous Multiples: Expecting twins, triplets, or more? This class covers the differences in labor, birth, parenting, and breastfeeding issues that face multiples' parents. NICU tour is included. Led by a Certified Childbirth Educator who is the mother of twins. No fee. Caring for Baby: This class is for expectant and adoptive parents who want to learn and practice the most up-to-date newborn care for their babies. Focus is on birth through the first six weeks of life. Topics include feeding, bathing, diapering, crying, umbilical cord care, circumcision care and safe sleep. Parents learn to recognize symptoms of illness and when to call the pediatrician. Register only the mom-to-be and your partner or support person can plan to come with you! $10 per registrant and support person Childbirth Class- online option: This online class offers you the freedom to complete a Birth and Baby series in the comfort of your own home. The flexibility of this option allows you to review sections at your own pace, at times convenient to you and your support people. It includes additional video information, animations, quizzes, and extended activities. Get organized with helpful eClass tools, checklists, and trackers. Once you register online for the class, you will receive an email within a few days to accept the invitation and begin the class when the time   is right for you. The content will be available to you for 60 days. $60 for 60 days of online access for you and your support people.  Local Doulas: Natural Baby Doulas [email or web address] Tel:  [phone] [email or web address] [employer] [phone] [email or web address] .[email or web address].[email or web address] The Labor Hassell Halim  (also do waterbirth tub rental) [phone] [email or web address] Triad Birth Doula [phone] [email or web address] NotebookDistributors.fi Sacred Rhythms  [phone] https://sacred-rhythms.com/ [employer] (PADA) [email or web address] https://www.frey.org/ [place] Birth and Baby  http://[email or web address]/ Considering Waterbirth? Guide for patients at [facility] for [employer]  Why consider waterbirth?  . Gentle birth for babies . Less pain medicine used in labor . May allow for passive descent/less pushing . May reduce perineal tears  . More mobility and instinctive maternal position changes . Increased maternal relaxation . Reduced blood pressure in labor  Is waterbirth safe? What are the risks of infection, drowning or other complications?  . Infection: o Very low risk (3.7 % for tub vs 4.8% for bed) o 7 in 8000 waterbirths with documented infection o Poorly cleaned equipment most common cause o Slightly lower group B strep transmission rate  . Drowning o Maternal:  - Very low risk   - Related to seizures or fainting o Newborn:  - Very low risk. No evidence of increased risk of respiratory problems in multiple large studies - Physiological protection from breathing under water - Avoid underwater birth if there are any fetal complications - Once baby's head is out of the water, keep it out.  . Birth complication o Some reports of cord trauma, but risk decreased by bringing baby to surface gradually o No evidence of increased risk of shoulder dystocia. Mothers can usually change positions faster in water than in a bed, possibly aiding the maneuvers to free the shoulder.   You must attend a Doren Custard class at [facility]  3rd Wednesday of every month from 7-9pm  [employer] by calling [phone] or online at [email or web address]  Bring [place] the certificate from the class to your prenatal appointment  Meet with a midwife at 36 weeks to see if you can still plan a waterbirth and to sign the consent.   Purchase or rent the following supplies:   Water Birth Pool (Birth Pool in a Box or [place] for instance)  (Tubs start ~$125)  Single-use disposable tub liner designed for your brand of tub  New garden hose labeled "lead-free", "suitable for drinking water",  Electric drain pump to remove water (We recommend 792 gallon per hour or greater pump.)   Separate garden hose to remove the dirty water  Fish net  Bathing suit top (optional)  Long-handled mirror (optional)  Places to purchase or rent supplies  GotWebTools.is for tub purchases and supplies  Waterbirthsolutions.com for tub purchases and supplies  The Labor Ladies (www.thelaborladies.com) $275 for tub rental/set-up & take down/kit   [employer] (http://www.fleming.com/.htm) Information regarding doulas (labor support) who provide pool rentals  Our practice has a Birth Pool in a Box tub at the hospital that you may borrow on a first-come-first-served basis. It is your responsibility to to set up, clean and break down the tub. We cannot guarantee the availability of this tub in advance. You are responsible for bringing all accessories listed above. If you do not have all necessary supplies you cannot have a waterbirth.    Things that would prevent you from having a waterbirth:  Premature, <37wks  Previous cesarean birth  Presence of thick meconium-stained fluid  Multiple gestation (Twins,  triplets, etc.)  Uncontrolled diabetes or gestational diabetes requiring medication  Hypertension requiring medication or diagnosis of pre-eclampsia  Heavy vaginal bleeding  Non-reassuring fetal  heart rate  Active infection (MRSA, etc.). Group B Strep is NOT a contraindication for  waterbirth.  If your labor has to be induced and induction method requires continuous  monitoring of the baby's heart rate  Other risks/issues identified by your obstetrical provider  Please remember that birth is unpredictable. Under certain unforeseeable circumstances your provider may advise against giving birth in the tub. These decisions will be made on a case-by-case basis and with the safety of you and your baby as our highest priority.   AREA PEDIATRIC/FAMILY PRACTICE PHYSICIANS  Central/[place]outheast [place] [phone]) . [facility] Davy Pique, MD; Gwendlyn Deutscher, MD; Walker Kehr, MD; Andria Frames, MD; McDiarmid, MD; Dutch Quint, MD; Nori Riis, MD; Mingo Amber, [place] [place], [place] [address] o [phone] o Mon-Fri 8:30-12:30, 1:30-5:00 o Providers come to see babies at [facility] o Accepting Medicaid . [place] at [place] providers who accept newborns: Dorthy Cooler, MD; Orland Mustard, MD; Stephanie Acre, MD o [place], [place], [place] [address] o [phone] o Mon-Fri 8:00-5:30 o Babies seen by providers at [facility] o Does NOT accept Medicaid o Please call early in hospitalization for appointment (limited availability)  . Mustard Royal Pines, MD o [address] [place], [place] [address] o [phone] o Mon, Tue, Thur, Fri 8:30-5:00, Wed 10:00-7:00 (closed 1-2pm) o Babies seen by [facility] providers o Accepting Medicaid . [place], MD o [place], [place], [place] [address] o [phone] o Mon-Fri 8:30-5:00, Sat 8:30-12:00 o Provider comes to see babies at [place] Medicaid o Must have been referred from current patients or contacted office prior to delivery . [place] [facility] ([place] for  [place]) Franne Forts, MD; Tamera Punt, MD; Doneen Poisson, MD; Fatima Sanger, MD; Wynetta Emery, MD; Jess Barters, MD; Tami Ribas, MD; Herbert Moors, MD; Derrell Lolling, MD; Dorothyann Peng, MD; Lucious Groves, NP; Baldo Ash, NP o [place], [place], [place] [address] o [phone] o Mon, Tue, Thur, Fri 8:30-5:30, Wed 9:30-5:30, Sat 8:30-12:30 o Babies seen by [facility] providers o Accepting Medicaid o Only accepting infants of first-time parents or siblings of current patients [facility] discharge coordinator will make follow-up appointment . Baltazar Najjar o [address], [place], [place]  [address] o [phone]   Fax - [phone] . [facility] o [address], [place], [place]  [address] o Phone - [phone]   Fax [phone] . [place], [place], [place], [place]  [address] o [phone]  [place]ortheast [place] [phone]) . [facility] Reginal Lutes, MD; Jacklynn Ganong, MD; Torrie Mayers, MD; MD; Rosana Hoes, MD; Servando [place]ina, MD; Rose Fillers, MD; Rex Kras, MD; Corinna Capra, MD; Volney American, MD; Trilby Drummer, MD; Janann Colonel, MD; Jimmye Norman, [place], [place], [place] [address] o [phone] o Mon-Fri 8:30-5:00 (extended evenings Mon-Thur as needed), Sat-Sun 10:00-1:00 o Providers come to see babies at [place] Medicaid for families of first-time babies and families with all children in the household age 20 and under. Must register with office prior to making appointment (M-F only). . [place], NP; Tomi Bamberger, MD; Redmond School, MD; [place], [place] [address] [place], [place] [address] o [phone] o Mon-Fri 8:00-5:00 o Babies seen by providers at [facility] o Does NOT accept  Medicaid/Commercial Insurance Only . [facility] at [place] ([facility])  Marnee Guarneri, MD; Drema Dallas, MD; Montine Circle, MD; Vilma Prader, MD; Vanita Panda, MD; Alfonso Ramus, MD; Ruthann Cancer, MD; Roxanne Mins, MD; Rosalva Ferron, MD; Polly Cobia, MD o [address]  [place], [place] [address] o [phone] o Mon-Fri 8:30-5:30, Sat (Oct.-Mar.) 9:00-1:00 o Babies seen by providers at [place] [phone]) . [name], MD; Suzan Slick, MD o [address], [place], [place] [address] o [phone] o Mon-Fri 8:30-5:00, Sat 8:30-12:00 o Providers come to see babies at [facility] o Does NOT accept Medicaid . [place] at [place], [place]; [place], MD; [place], [place]; Nancy Fetter, MD; Moreen Fowler, [place], [place], [place] [address] o [phone] o Mon-Fri 8:00-5:00 o Babies seen by providers at [facility] o Does NOT accept Medicaid o Only accepting babies of parents who are patients o Please call early in hospitalization for appointment (limited availability) . [facility] Pediatricians Blanca Friend, MD; Sharlene Motts, MD; Rod Can, MD; Warner Mccreedy, NP; Sabra Heck, MD; Ermalinda Memos, MD; Sharlett Iles, NP; Aurther Loft, MD; Jerrye Beavers, MD; Marcello Moores, MD; Berline Lopes, MD; Charolette Forward, MD o [place]. [place], [place], [place] [address] o [phone] o Mon-Fri 8:00-5:00, Sat 9:00-12:00 o Providers come to see babies at [facility] o Does NOT accept [facility] [phone]) . [place] at [place] providers accepting new patients: Dayna Ramus, NP; [place], [place], [place], [place] [address] o [phone] o Mon-Fri 8:00-5:00 o Babies seen by providers at [facility] o Does NOT accept Medicaid o Only accepting babies of parents who are patients o Please call early in hospitalization for appointment (limited availability) . [facility] Oswaldo Conroy, MD; Sheran Lawless, MD o [address] [place], [place] [address] o [phone] (press 1 to schedule appointment) o Mon-Fri 8:00-5:00 o Providers come to see babies at [facility] o Does NOT accept Medicaid . [facility] Jodi Mourning, MD o [address], [place], [place]  [address] o [phone] o Mon-Fri 8:30-5:00 (lunch 12:30-1:00), extended hours by appointment only Wed 5:00-6:30 o Babies seen by [facility] providers o Accepting Medicaid . [place] at Evalyn Casco, MD; [place], MD; [name] Gallery, MD o [place], [place], [place] [address] o [phone] o Mon-Fri 8:00-5:00 o Babies seen by [facility] providers o Does NOT accept Medicaid . Therapist, music at [place], MD; Yong Channel, MD; [place], [place] [address] [place], [place] [address] o [phone] o Mon-Fri 8:00-5:00 o Babies seen by [facility] providers o Does NOT accept Medicaid . [place], [place]; [place], [place]; [place], NP; Albertina Parr, MD; Frederic Jericho, MD; Ronney Lion, MD; Carlos Levering, NP; Jerelene Redden, NP; Tomasita Crumble, NP; Ronelle Nigh, NP; Corinna Lines, MD; [place], MD o [place]., [place], [place] [address] o [phone] o Mon-Fri 8:30-5:00, Sat 10:00-1:00 o Providers come to see babies at [facility] o Does NOT accept Medicaid o Free prenatal information session Tuesdays at 4:45pm . [facility] Porfirio Oar, MD; [place], [place]; [place], [place]; [place], [place]., [place] [address] o [phone] o Mon-Fri 7:30-5:30 o Babies seen by [facility] providers . [facility] Children's Doctor o [address] [place], [place], [place]  [address] o [phone]   Fax - [phone]  [place] [phone] & [phone]) . [place], MD o [address] [place],  [address] o [phone] o Mon-Thur  8:00-6:00 o Providers come to see babies at [place] Medicaid . [place], NP; Melford Aase, MD; [place], [place]; [place], [address] [place], [place] [address] o [phone] o Mon-Thur 7:30-7:30, Fri 7:30-4:30 o Babies seen by [facility] providers o Accepting Medicaid . [facility] Nyra Jabs, MD; Cristino Martes,  NP; Gertie Baron, MD o [place] [address], [place], [place] [address] o [phone] o Mon-Fri 8:30-5:00, Sat 8:30-12:00 o Providers come to see babies at [place] Medicaid o Must have "Meet & Greet" appointment at office prior to delivery . [place] ([place]) Jodene Nam, MD; Juleen China, MD; Clydene Laming, [place] [address], [place], [place] [address] o [phone] o Mon-Wed 8:00-6:00, Thur-Fri 8:00-5:00, Sat 9:00-12:00 o Providers come to see babies at [facility] o Does NOT accept Medicaid o Only accepting siblings of current patients . [facility] [place], [place], [place], [place]  [address] o [phone]   Fax [phone] . [place] at [place] [address] [place], [place]  [address] o [phone]   Fax - [place] [phone] & [phone]) . Therapist, music at [place], DO; [place], [place]., [place], [place] [address] o [phone] o Mon-Fri 7:00-5:00 o Babies seen by [facility] providers o Does NOT accept Medicaid . Corder, MD; [place], [place]; [place], [place], [place], [place] [address] o [phone] o Mon-Fri 8:00-5:00 o Babies seen by [facility] providers o Accepting Medicaid . [place], MD; [place], [place]; [place], NP; [place], [place], [place], [place] [address] o [phone] o Mon-Fri 8:00-5:00 o Babies seen by providers at [place] [phone]) . [facility] [place], [place] o [address] [place], [place] [address] o [phone] o Mon-Fri 8:00-5:00 o Babies seen by [facility] providers o Does NOT accept  Medicaid o Limited availability, please call early in hospitalization to schedule follow-up . [facility] Leilani Merl, PA; Maisie Fus, MD; [place], MD; [place], [place]; Jeannine Kitten, MD; [place], [place] [facility] [address], [place], [place] [address] o [phone] o Mon-Fri 8:30-5:00, Sat 9:00-12:00 o Babies seen by providers at [facility] o Accepting Medicaid o Please register online then schedule online or call office o [email or web address] . [place] ([place] [facility] [place]) Kristian Covey, NP; Dwyane Dee, MD; Leonidas Romberg, PA o [address] [place], [place], [place] [address] o [phone] o Mon-Fri 8:00-5:00 o Babies seen by providers at [facility] o Accepting Medicaid . [place] ([facility] [employer]) Dairl Ponder, MD; Rayvon Char, NP; Melina Modena, MD o [address] [place], [place], [place] [address] o [phone] o Mon-Fri 8:00-5:30, Sat&Sun by appointment (phones open at 8:30) o Babies seen by [facility] providers o Accepting Medicaid o Must be a first-time baby or sibling of current patient . [place], [address], [place], [place]  [address] o [phone]   Fax - [phone]  [place] [phone] & [phone]) . [place], [place]; [place], [place]; Benjamine Mola, MD; [place], [place]; Harrell Lark, [place]  Barbara Cower [place], [place] [address] o [phone] o Mon-Thur 8:00-7:00, Fri 8:00-5:00, Sat 8:00-12:00, Sun 9:00-12:00 o Babies seen by [facility] providers o Accepting Medicaid . [facility] at [facility], MD; Ruthann Cancer, MD; [facility], MD o [address], [place], [place] [address] o [phone] o Mon-Thur 8:00-5:00 o Babies seen by providers at [facility] o Accepting Medicaid . [facility] at [place], MD; [place], MD; Amedeo Plenty,  MD; Bobby Rumpf, MD; List, MD; Lavonia Drafts, MD; Ruthann Cancer, MD; Selinda Eon, MD; Audie Box, MD; Jim Like, MD; Christie Nottingham, MD; Hubbard Hartshorn, MD; Modena Nunnery, MD o [address] [place], [place] [address] o [phone] o Mon-Fri 8:00-5:30, Sat (Oct.-Mar.) 9:00-1:00 o Babies seen by providers at [facility] o Accepting Medicaid o Must fill out new patient packet, available online at [email or web address] . [place] ([facility]) Barnabas Lister, NP; Kenton Kingfisher, NP; Claiborne Billings, NP; Rolla Plate, MD; [place], [place]; Carola Rhine, MD; Tyron Russell, MD; Delia Chimes, NP o [phone] [address], [place], [place] [address] o [phone] o Mon-Thur 8:00-5:30, Fri 8:00-5:00 o Babies seen by providers at [place] [phone]) . [place], [place]; Perkins, MD; Dennard Schaumann, MD; [place], [place] o [address] [place], [place] [address] o [phone] o Mon-Fri 8:00-5:00 o Babies seen by providers at [place] [phone]) . [place] at [place], [place]; Olen Pel, MD; [place], [place], [place], [place] [address] o [phone] o Mon-Fri 8:00-5:00 o Babies seen by providers at [facility] o Does NOT accept Medicaid o Limited appointment availability, please call early in hospitalization  . Therapist, music at [place], [place]; [place], [address] [place], [place] [address] o [phone] o Mon-Fri 8:00-5:00 o Babies seen by [facility] providers o Does NOT accept Medicaid . [facility] Su Grand, MD; Guy Sandifer, MD; [place], [place]; [place], [address], [place], [place] [address] o [phone] o Mon-Fri 8:00-5:00 o After hours clinic [facility] [place], [place] [address]) [phone] Mon-Fri 5:00-8:00, Sat 12:00-6:00, Sun 10:00-4:00 o Babies seen by [facility]  providers o Accepting Medicaid . [place] at [facility] o [address], [place],   [address] o [phone]   Fax - [phone]  [place] [phone]) . Therapist, music at [facility], [place] o [address] [place], [place],  [address] o [phone] o Mon-Fri 8:00-5:00 o Babies seen by [facility] providers o Does NOT accept Medicaid . [place] ([place] at [place]) Bing Neighbors, MD o [address] [place], [place],  [address] o [phone] o Mon-Thur 8:00-7:00, Fri 8:00-5:00, Sat 8:00-12:00 o Babies seen by providers at [facility] o Accepting Medicaid - but does not have vaccinations in office (must be received elsewhere) o Limited availability, please call early in hospitalization  [place] ([phone]) . [place], [place] [address], [place] [address] o [phone]  Fax [phone]

## 2019-02-27 NOTE — Progress Notes (Signed)
   PRENATAL VISIT NOTE  Subjective:  Raven Medina is a 18 y.o. G1P0 at [redacted]w[redacted]d being seen today for her first renatal care visit. She is currently monitored for the following issues for this low-risk pregnancy and has ADHD (attention deficit hyperactivity disorder); Adjustment disorder with anxiety; Acne vulgaris; Supervision of normal first pregnancy, antepartum; Asthma affecting pregnancy in first trimester; and Anemia in pregnancy, first trimester on their problem list.  Patient reports no complaints.  Contractions: Not present. Vag. Bleeding: None.  Movement: Absent. Denies leaking of fluid.   The following portions of the patient's history were reviewed and updated as appropriate: allergies, current medications, past family history, past medical history, past social history, past surgical history and problem list.   Objective:   Vitals:   02/27/19 1420  BP: 119/78  Weight: 191 lb (86.6 kg)    Fetal Status:     Movement: Absent    Unable to obtain FHR with doppler. POCUS used to confirm viability. SIUP noted with +FM and +FHR although unable to measure rate, appeared normal.   General:  Alert, oriented and cooperative. Patient is in no acute distress.  Skin: Skin is warm and dry. No rash noted.   Cardiovascular: Normal heart rate and rhythm noted  Respiratory: Normal respiratory effort, no problems with respiration noted. Clear to auscultation.   Abdomen: Soft, gravid, appropriate for gestational age. Normal bowel sounds. Non-tender.  Pain/Pressure: Absent     Pelvic: Cervical exam deferred        Extremities: Normal range of motion.  Edema: None  Mental Status: Normal mood and affect. Normal behavior. Normal judgment and thought content.   Assessment and Plan:  Pregnancy: G1P0 at [redacted]w[redacted]d 1. Supervision of normal first pregnancy, antepartum - Culture, OB Urine - Obstetric Panel, Including HIV - GC/Chlamydia probe amp ([place])not at [facility] - [place] MFM OB COMP + 14 WK; Scheduled   - Genetic Screening - [facility] and [facility] - Patient recently had HgbA1c with PCP while pregnant, although did not know she was pregnant at the time - Too young for a pap smear  - Due date based on LMP as patient states she is 147% certain of LMP and early US shows EDD only 2 days different  2. Asthma affecting pregnancy in first trimester - Well-controlled   3. Anemia in pregnancy, first trimester - Per patient, CBC today will confirm current levels  - Patient is not currently taking iron supplements - Patient is taking PNV, however not every day. Encouraged to take daily   Petra Kuba of our practice with multiple providers discussed  Patient advised of cadence of prenatal visits with BabyRx optimization Patient is active on baby Rx. Rx for cuff sent and patient will receive in the mail.   Preterm labor symptoms and general obstetric precautions including but not limited to vaginal bleeding, contractions, leaking of fluid and fetal movement were reviewed in detail with the patient. Please refer to After Visit Summary for other counseling recommendations.   Return in about 8 weeks (around 04/24/2019) for LOB, Virtual, Babyscipts.  Future Appointments  Date Time Provider [place]  04/22/2019 10:15 AM [facility] [place] 4 [facility]-MFCUS MFC-US  04/25/2019  1:50 PM Laury Deep, CNM [facility] None    Kerry Hough, PA-C

## 2019-02-28 ENCOUNTER — Encounter: Payer: Self-pay | Admitting: General Practice

## 2019-02-28 LAB — OBSTETRIC PANEL, INCLUDING HIV
Antibody Screen: NEGATIVE
Basophils Absolute: 0 10*3/uL (ref 0.0–0.2)
Basos: 1 %
EOS (ABSOLUTE): 0.1 10*3/uL (ref 0.0–0.4)
Eos: 2 %
HIV Screen 4th Generation wRfx: NONREACTIVE
Hematocrit: 34.7 % (ref 34.0–46.6)
Hemoglobin: 11.5 g/dL (ref 11.1–15.9)
Hepatitis B Surface Ag: NEGATIVE
Immature Grans (Abs): 0 10*3/uL (ref 0.0–0.1)
Immature Granulocytes: 0 %
Lymphocytes Absolute: 2 10*3/uL (ref 0.7–3.1)
Lymphs: 34 %
MCH: 25.8 pg — ABNORMAL LOW (ref 26.6–33.0)
MCHC: 33.1 g/dL (ref 31.5–35.7)
MCV: 78 fL — ABNORMAL LOW (ref 79–97)
Monocytes Absolute: 0.5 10*3/uL (ref 0.1–0.9)
Monocytes: 9 %
Neutrophils Absolute: 3.1 10*3/uL (ref 1.4–7.0)
Neutrophils: 54 %
Platelets: 169 10*3/uL (ref 150–450)
RBC: 4.46 x10E6/uL (ref 3.77–5.28)
RDW: 14.5 % (ref 11.7–15.4)
RPR Ser Ql: NONREACTIVE
Rh Factor: POSITIVE
Rubella Antibodies, IGG: 4.65 index (ref >0.99)
WBC: 5.8 10*3/uL (ref 3.4–10.8)

## 2019-02-28 NOTE — Progress Notes (Signed)
Subjective: Raven Medina is a G1P0 at [redacted]w[redacted]d who presents to the [facility] today for ob visit.  She does not  have a history of any mental health concerns. She is  currently sexually active. Raven Medina reports previous use of nexplanon and pills  for birth control. Patient states father of baby and immediate family as her support system.   BP 119/78   Wt 191 lb (86.6 kg)   LMP 12/09/2018 (Exact Date)   BMI 31.78 kg/m   Birth Control History:  Nexplanon and pills   MDM Patient counseled on all options for birth control today including LARC. Patient desires condoms initiated for birth control  Assessment:  18 y.o. female considering condoms  for birth control  Plan: No further plan   Raven Medina, Raven Medina 02/28/2019 11:34 AM

## 2019-03-01 ENCOUNTER — Telehealth: Payer: Self-pay | Admitting: *Deleted

## 2019-03-01 LAB — URINE CYTOLOGY ANCILLARY ONLY
Chlamydia: NEGATIVE
Neisseria Gonorrhea: POSITIVE — AB

## 2019-03-01 LAB — URINE CULTURE, OB REFLEX

## 2019-03-01 LAB — CULTURE, OB URINE

## 2019-03-01 NOTE — Telephone Encounter (Signed)
Left voice message for patient to return nurse call regarding test results.  Christophe Rising L, RN  

## 2019-03-01 NOTE — Telephone Encounter (Signed)
-----   Message from Luvenia Redden, PA-C sent at 03/01/2019 10:08 AM EDT ----- Please inform patient of +GC and have her return to the office for treatment with Rocephin and Zithromax. Advise partner treatment is needed. Abstain from intercourse for 2 weeks following partner treatment. Send STD report.  Thank you,  Luvenia Redden, PA-C 03/01/2019 10:08 AM

## 2019-03-04 ENCOUNTER — Other Ambulatory Visit: Payer: Self-pay

## 2019-03-04 ENCOUNTER — Ambulatory Visit (INDEPENDENT_AMBULATORY_CARE_PROVIDER_SITE_OTHER): Payer: Medicaid Other | Admitting: *Deleted

## 2019-03-04 VITALS — BP 124/78 | HR 97 | Temp 98.2°F | Ht 65.0 in | Wt 189.0 lb

## 2019-03-04 DIAGNOSIS — O98211 Gonorrhea complicating pregnancy, first trimester: Secondary | ICD-10-CM

## 2019-03-04 DIAGNOSIS — O98219 Gonorrhea complicating pregnancy, unspecified trimester: Secondary | ICD-10-CM

## 2019-03-04 DIAGNOSIS — Z3A12 12 weeks gestation of pregnancy: Secondary | ICD-10-CM | POA: Diagnosis not present

## 2019-03-04 MED ORDER — AZITHROMYCIN 500 MG PO TABS
1000.0000 mg | ORAL_TABLET | Freq: Once | ORAL | Status: AC
  Administered 2019-03-04: 1000 mg via ORAL

## 2019-03-04 MED ORDER — CEFTRIAXONE SODIUM 250 MG IJ SOLR
250.0000 mg | Freq: Once | INTRAMUSCULAR | Status: AC
  Administered 2019-03-04: 16:00:00 250 mg via INTRAMUSCULAR

## 2019-03-04 NOTE — Progress Notes (Signed)
   S: Patient in nurse clinic today for STD treatment of Gonorrhea.    O: Need for STD treatment.  A: Azithromycin 1 GM PO x 1 given and Ceftriaxone 250 mg IM x 1 given in LUOQ per Kerry Hough, PA's orders.  Patient observed 15 minutes in office.  No reaction noted.   P: Patient to follow up in 1 month for re-screening.   STD report form fax completed and faxed to [facility] Department at [phone]. Patient advised to abstain from sex for 7-10 days after treatment or when partner has been tested/treated.      Derl Barrow, RN

## 2019-03-04 NOTE — Patient Instructions (Signed)
Pregnancy and Sexually Transmitted Infections An STI (sexually transmitted infection) is a disease or infection that may be passed (transmitted) from person to person, usually during sexual activity. This may happen by way of saliva, semen, blood, vaginal mucus, or urine. An STI can be caused by bacteria, viruses, or parasites. During pregnancy, STIs can be dangerous for you and your unborn baby. It is important to take steps to reduce your chances of getting an STI. Also, you need to be seen by your health care provider right away if you think you may have an STI, or if you think you may have been exposed to an STI. Diagnosis and treatment will depend on the type of STI. If you are already pregnant, you will be screened for HIV (human immunodeficiency virus) early in your pregnancy. If you are at high risk for HIV, this test may be repeated during your third trimester of pregnancy. What are some common STIs? There are different types of STIs. Some STIs that cause problems in pregnancy include:  Gonorrhea.  Chlamydia.  Syphilis.  HIV and AIDS (acquired immunodeficiency syndrome).  Genital herpes.  Hepatitis.  Genital warts.  Human papillomavirus (HPV).  Trichomoniasis. STIs that do not affect the baby include:  Chancroid.  Pubic lice. What are the possible effects of STIs during pregnancy? STIs can cause:  Stillbirth.  Miscarriage.  Premature labor.  Premature rupture of the membranes.  Serious birth defects or deformities.  Infection of the amniotic sac.  Infections that occur after birth (postpartum) in you and the baby.  Slowed growth of the baby before birth.  Illnesses in newborns. What are common symptoms of STIs? Different STIs have different symptoms. Some women may not have any symptoms. If symptoms are present, they may include:  Painful or bloody urination.  Pain in the pelvis, abdomen, vagina, anus, throat, or eyes.  A skin rash, itching, or  irritation.  Growths, ulcerations, blisters, or sores in the genital and anal areas.  Fever.  Abnormal vaginal discharge, with or without bad odor.  Pain or bleeding during sexual intercourse.  Yellowing of the skin and the white parts of the eyes (jaundice).  Swollen glands in the groin area. Even if symptoms are not present, an STI can still be passed to another person during sexual contact. How are STIs diagnosed? Your health care provider can use tests to determine if you have an STI. These may include blood tests, urine tests, and tests performed during a pelvic exam. You should be screened for STIs, including gonorrhea and chlamydia, if:  You are sexually active and are younger than age 74.  You are age 45 or older and your health care provider tells you that you are at risk for these types of infections.  Your sexual activity has changed since the last time you were screened, and you are at an increased risk for chlamydia or gonorrhea. Ask your health care provider if you are at risk. How can I reduce my risk of getting an STI? Take these actions to reduce your risk of getting an STI:  Do not have any oral, vaginal, or anal sex. This is known as practicing abstinence.  If you have sex, use a latex condom or a female condom consistently and correctly during sexual intercourse.  Use dental dams and water-soluble lubricants during sexual activity. Do not use petroleum jelly or oils.  Avoid having multiple sexual partners.  Do not have sex with someone who has other sexual partners.  have sex with anyone you do not know or who is at high risk for an STI.  Avoid risky sex acts that can break the skin.  Do not have sex if you have open sores on your mouth or skin.  Avoid engaging in oral and anal sex acts.  Get the hepatitis vaccine. It is safe for pregnant women.  What should I do if I think I have an STI?  See your health care provider.  Tell your sexual  partner or partners. They should be tested and treated for any STIs.  Do not have sex until your health care provider says it is okay. Get help right away if: Contact your health care provider right away if:  You have any symptoms of an STI.  You think that you have, or your sexual partner has, an STI even if there are no symptoms.  You think that you may have been exposed to an STI. This information is not intended to replace advice given to you by your health care provider. Make sure you discuss any questions you have with your health care provider. Document Released: 08/18/2004 Document Revised: 11/02/2018 Document Reviewed: 02/15/2016 Elsevier Patient Education  2020 Elsevier Inc.  

## 2019-03-06 ENCOUNTER — Encounter: Payer: Self-pay | Admitting: General Practice

## 2019-03-29 ENCOUNTER — Ambulatory Visit (HOSPITAL_COMMUNITY)
Admission: EM | Admit: 2019-03-29 | Discharge: 2019-03-29 | Disposition: A | Discharge status: Home / Self Care | Payer: Medicaid Other | Attending: Family Medicine | Admitting: Family Medicine

## 2019-03-29 ENCOUNTER — Other Ambulatory Visit: Payer: Self-pay

## 2019-03-29 ENCOUNTER — Encounter (HOSPITAL_COMMUNITY): Payer: Self-pay

## 2019-03-29 DIAGNOSIS — Z113 Encounter for screening for infections with a predominantly sexual mode of transmission: Secondary | ICD-10-CM | POA: Diagnosis present

## 2019-03-29 NOTE — ED Provider Notes (Signed)
[facility]    CSN: [id number] Arrival date & time: 03/29/19  1002      History   Chief Complaint Chief Complaint  Patient presents with  . STD Testing    HPI Raven Medina is a 18 y.o. female.   Raven Medina presents with requests for screen for std. No known exposure. Sexually active  With 1 partner, doesn't use condoms. She is [redacted] weeks pregnant, has been seeing OB. Unsure when most recent screen was. Has had stds in the past. Denies vaginal or urinary symptoms. No pelvic or abdominal pain.    ROS per HPI, negative if not otherwise mentioned.      Past Medical History:  Diagnosis Date  . ADHD (attention deficit hyperactivity disorder)   . Allergy    seasonal  . Asthma    intermittent  . Iron deficiency anemia 04/17/2014  . Obesity     Patient Active Problem List   Diagnosis Date Noted  . Asthma affecting pregnancy in first trimester 02/27/2019  . Anemia in pregnancy, first trimester 02/27/2019  . Supervision of normal first pregnancy, antepartum 02/19/2019  . Acne vulgaris 01/11/2019  . Adjustment disorder with anxiety 07/10/2015  . ADHD (attention deficit hyperactivity disorder) 12/05/2012    Past Surgical History:  Procedure Laterality Date  . myringotomy with tubes Bilateral    at age 89  . WISDOM TOOTH EXTRACTION      OB History    Gravida  1   Para      Term      Preterm      AB      Living        SAB      TAB      Ectopic      Multiple      Live Births               Home Medications    Prior to Admission medications   Medication Sig Start Date End Date Taking? Authorizing Provider  Prenatal Vit-Fe Fumarate-FA (PRENATAL VITAMIN) 27-0.8 MG TABS Take 1 tablet by mouth daily. 01/09/19   Janalyn HarderLee, Amalia I, MD    Family History Family History  Problem Relation Age of Onset  . Hypertension Mother   . Hypertension Maternal Grandmother   . Hypertension Maternal Grandfather     Social History Social History    Tobacco Use  . Smoking status: Passive Smoke Exposure - Never Smoker  . Smokeless tobacco: Never Used  . Tobacco comment: mother smokes inside home  Substance Use Topics  . Alcohol use: No    Alcohol/week: 0.0 standard drinks  . Drug use: Not Currently    Types: Marijuana    Comment: Last smoked 01/08/19     Allergies   Patient has no known allergies.   Review of Systems Review of Systems   Physical Exam Triage Vital Signs ED Triage Vitals  Enc Vitals Group     BP 03/29/19 1018 120/69     Pulse Rate 03/29/19 1018 85     Resp 03/29/19 1018 17     Temp 03/29/19 1018 98.6 F (37 C)     Temp Source 03/29/19 1018 Oral     SpO2 03/29/19 1018 96 %     Weight --      Height --      Head Circumference --      Peak Flow --      Pain Score 03/29/19 1019 0     Pain  Loc --      Pain Edu? --      Excl. in [place]? --    No data found.  Updated Vital Signs BP 120/69 (BP Location: Right Arm)   Pulse 85   Temp 98.6 F (37 C) (Oral)   Resp 17   LMP 12/09/2018 (Exact Date)   SpO2 96%   Visual Acuity Right Eye Distance:   Left Eye Distance:   Bilateral Distance:    Right Eye Near:   Left Eye Near:    Bilateral Near:     Physical Exam Constitutional:      General: She is not in acute distress.    Appearance: She is well-developed.  Cardiovascular:     Rate and Rhythm: Normal rate.  Pulmonary:     Effort: Pulmonary effort is normal.  Abdominal:     Palpations: Abdomen is soft. Abdomen is not rigid.     Tenderness: There is no abdominal tenderness. There is no guarding or rebound.  Genitourinary:    Comments: Denies sores, lesions, vaginal bleeding; no pelvic pain; gu exam deferred at this time, vaginal self swab collected.   Skin:    General: Skin is warm and dry.  Neurological:     Mental Status: She is alert and oriented to person, place, and time.      UC Treatments / Results  Labs (all labs ordered are listed, but only abnormal results are displayed)  Labs Reviewed  CERVICOVAGINAL ANCILLARY ONLY    EKG   Radiology No results found.  Procedures Procedures (including critical care time)  Medications Ordered in UC Medications - No data to display  Initial Impression / Assessment and Plan / UC Course  I have reviewed the triage vital signs and the nursing notes.  Pertinent labs & imaging results that were available during my care of the patient were reviewed by me and considered in my medical decision making (see chart for details).     Vaginal cytology collected and pending. No known exposure. Will notify of any positive findings and if any changes to treatment are needed.  Patient verbalized understanding and agreeable to plan.   Final Clinical Impressions(s) / UC Diagnoses   Final diagnoses:  Screen for STD (sexually transmitted disease)     Discharge Instructions     Will notify you of any positive findings and if any changes to treatment are needed.   You may monitor your results on your MyChart online as well.     ED Prescriptions    None     Controlled Substance Prescriptions [place] Controlled Substance Registry consulted? Not Applicable   Zigmund Gottron, NP 03/29/19 1057

## 2019-03-29 NOTE — Discharge Instructions (Signed)
Will notify you of any positive findings and if any changes to treatment are needed.   °You may monitor your results on your MyChart online as well.   °

## 2019-03-29 NOTE — ED Triage Notes (Signed)
Pt presents to have STD testing with no complaints of any symptoms.

## 2019-04-02 ENCOUNTER — Ambulatory Visit: Payer: Medicaid Other

## 2019-04-03 ENCOUNTER — Encounter (HOSPITAL_COMMUNITY): Payer: Self-pay

## 2019-04-03 ENCOUNTER — Telehealth (HOSPITAL_COMMUNITY): Payer: Self-pay | Admitting: Emergency Medicine

## 2019-04-03 LAB — CERVICOVAGINAL ANCILLARY ONLY
Bacterial vaginitis: POSITIVE — AB
Candida vaginitis: NEGATIVE
Chlamydia: NEGATIVE
Neisseria Gonorrhea: NEGATIVE
Trichomonas: NEGATIVE

## 2019-04-03 MED ORDER — METRONIDAZOLE 0.75 % VA GEL: 1.0000 | Freq: Every day | VAGINAL | 0 refills | Status: AC

## 2019-04-03 NOTE — Telephone Encounter (Signed)
Bacterial vaginosis is positive. This was not treated at the urgent care visit.  metrogel no refills sent to patients pharmacy of choice.    Attempted to reach patient. No answer at this time. Voicemail left.

## 2019-04-04 ENCOUNTER — Telehealth (HOSPITAL_COMMUNITY): Payer: Self-pay | Admitting: Emergency Medicine

## 2019-04-04 NOTE — Telephone Encounter (Signed)
Attempted to reach patient x2. No answer at this time. Voicemail left.    

## 2019-04-08 ENCOUNTER — Telehealth (HOSPITAL_COMMUNITY): Payer: Self-pay | Admitting: Emergency Medicine

## 2019-04-08 NOTE — Telephone Encounter (Signed)
Attempted to reach patient x3. No answer at this time.

## 2019-04-22 ENCOUNTER — Other Ambulatory Visit (HOSPITAL_COMMUNITY): Payer: Self-pay | Admitting: *Deleted

## 2019-04-22 ENCOUNTER — Other Ambulatory Visit: Payer: Self-pay

## 2019-04-22 ENCOUNTER — Ambulatory Visit (HOSPITAL_COMMUNITY)
Admission: RE | Admit: 2019-04-22 | Discharge: 2019-04-22 | Disposition: A | Discharge status: Home / Self Care | Payer: Medicaid Other | Source: Ambulatory Visit | Attending: Obstetrics and Gynecology | Admitting: Obstetrics and Gynecology

## 2019-04-22 DIAGNOSIS — Z3400 Encounter for supervision of normal first pregnancy, unspecified trimester: Secondary | ICD-10-CM | POA: Diagnosis present

## 2019-04-22 DIAGNOSIS — Z362 Encounter for other antenatal screening follow-up: Secondary | ICD-10-CM

## 2019-04-22 DIAGNOSIS — Z3689 Encounter for other specified antenatal screening: Secondary | ICD-10-CM | POA: Insufficient documentation

## 2019-04-22 DIAGNOSIS — Z3A19 19 weeks gestation of pregnancy: Secondary | ICD-10-CM | POA: Diagnosis not present

## 2019-04-22 DIAGNOSIS — Z363 Encounter for antenatal screening for malformations: Secondary | ICD-10-CM

## 2019-04-25 ENCOUNTER — Encounter: Payer: Self-pay | Admitting: Obstetrics and Gynecology

## 2019-04-25 ENCOUNTER — Telehealth (INDEPENDENT_AMBULATORY_CARE_PROVIDER_SITE_OTHER): Payer: Medicaid Other | Admitting: Obstetrics and Gynecology

## 2019-04-25 ENCOUNTER — Other Ambulatory Visit: Payer: Self-pay

## 2019-04-25 VITALS — BP 126/80 | HR 95

## 2019-04-25 DIAGNOSIS — Z3A19 19 weeks gestation of pregnancy: Secondary | ICD-10-CM

## 2019-04-25 DIAGNOSIS — Z3400 Encounter for supervision of normal first pregnancy, unspecified trimester: Secondary | ICD-10-CM

## 2019-04-25 DIAGNOSIS — Z3402 Encounter for supervision of normal first pregnancy, second trimester: Secondary | ICD-10-CM

## 2019-04-25 NOTE — Progress Notes (Signed)
   MY CHART VIDEO VIRTUAL OBSTETRICS VISIT ENCOUNTER NOTE  I connected with Katrinia Cahall on 04/25/19 at  1:50 PM EDT by My Chart video at home and verified that I am speaking with the correct person using two identifiers.   I discussed the limitations, risks, security and privacy concerns of performing an evaluation and management service by My Chart video and the availability of in person appointments. I also discussed with the patient that there may be a patient responsible charge related to this service. The patient expressed understanding and agreed to proceed.  Subjective:  Raven Medina is a 18 y.o. G1P0 at [redacted]w[redacted]d being followed for ongoing prenatal care.  She is currently monitored for the following issues for this low-risk pregnancy and has ADHD (attention deficit hyperactivity disorder); Adjustment disorder with anxiety; Acne vulgaris; Supervision of normal first pregnancy, antepartum; Asthma affecting pregnancy in first trimester; and Anemia in pregnancy, first trimester on their problem list.  Patient reports no complaints. Reports feeling flutters about 2 wks ago.Reports fetal movement. Denies any contractions, bleeding or leaking of fluid.   The following portions of the patient's history were reviewed and updated as appropriate: allergies, current medications, past family history, past medical history, past social history, past surgical history and problem list.   Objective:   General:  Alert, oriented and cooperative.   Mental Status: Normal mood and affect perceived. Normal judgment and thought content.  Rest of physical exam deferred due to type of encounter  BP 126/80   Pulse 95   LMP 12/09/2018 (Exact Date)  **Done by patient's own at home BP cuff  Patient does not own a scale  Assessment and Plan:  Pregnancy: G1P0 at 104w4d Supervision of normal first pregnancy, antepartum - Anticipatory guidance for nv via [place] the 2 hr GTT @ 28 wks  Preterm labor symptoms and  general obstetric precautions including but not limited to vaginal bleeding, contractions, leaking of fluid and fetal movement were reviewed in detail with the patient.  I discussed the assessment and treatment plan with the patient. The patient was provided an opportunity to ask questions and all were answered. The patient agreed with the plan and demonstrated an understanding of the instructions. The patient was advised to call back or seek an in-person office evaluation/go to [facility] at [facility] for any urgent or concerning symptoms. Please refer to After Visit Summary for other counseling recommendations.   I provided 5 minutes of non-face-to-face time during this encounter. There was 5 minutes of chart review time spent prior to this encounter. Total time spent = 10 minutes.  Return in about 5 weeks (around 05/30/2019) for Return OB - My Chart video.  Future Appointments  Date Time Provider [place]  05/20/2019  2:45 PM [facility] NURSE [facility]FC-US  05/20/2019  2:45 PM [place] 2 WH-MFCUS MFC-US  05/30/2019 10:50 AM Laury Deep, CNM [facility] None    Laury Deep, [place] for [employer], [place] [employer]

## 2019-05-15 ENCOUNTER — Inpatient Hospital Stay (HOSPITAL_COMMUNITY)
Admission: AD | Admit: 2019-05-15 | Discharge: 2019-05-15 | Disposition: A | Discharge status: Home / Self Care | Payer: Medicaid Other | Attending: Family Medicine | Admitting: Family Medicine

## 2019-05-15 ENCOUNTER — Other Ambulatory Visit: Payer: Self-pay

## 2019-05-15 ENCOUNTER — Encounter (HOSPITAL_COMMUNITY): Payer: Self-pay | Admitting: *Deleted

## 2019-05-15 ENCOUNTER — Inpatient Hospital Stay (HOSPITAL_BASED_OUTPATIENT_CLINIC_OR_DEPARTMENT_OTHER): Payer: Medicaid Other

## 2019-05-15 DIAGNOSIS — O9A211 Injury, poisoning and certain other consequences of external causes complicating pregnancy, first trimester: Secondary | ICD-10-CM | POA: Diagnosis not present

## 2019-05-15 DIAGNOSIS — S3991XA Unspecified injury of abdomen, initial encounter: Secondary | ICD-10-CM

## 2019-05-15 DIAGNOSIS — R102 Pelvic and perineal pain: Secondary | ICD-10-CM | POA: Diagnosis not present

## 2019-05-15 DIAGNOSIS — O9A212 Injury, poisoning and certain other consequences of external causes complicating pregnancy, second trimester: Secondary | ICD-10-CM

## 2019-05-15 DIAGNOSIS — O26899 Other specified pregnancy related conditions, unspecified trimester: Secondary | ICD-10-CM

## 2019-05-15 DIAGNOSIS — Z3A22 22 weeks gestation of pregnancy: Secondary | ICD-10-CM

## 2019-05-15 DIAGNOSIS — O26892 Other specified pregnancy related conditions, second trimester: Secondary | ICD-10-CM | POA: Insufficient documentation

## 2019-05-15 DIAGNOSIS — R109 Unspecified abdominal pain: Secondary | ICD-10-CM | POA: Diagnosis not present

## 2019-05-15 LAB — URINALYSIS, ROUTINE W REFLEX MICROSCOPIC
Bilirubin Urine: NEGATIVE
Glucose, UA: NEGATIVE mg/dL
Hgb urine dipstick: NEGATIVE
Ketones, ur: NEGATIVE mg/dL
Leukocytes,Ua: NEGATIVE
Nitrite: NEGATIVE
Protein, ur: NEGATIVE mg/dL
Specific Gravity, Urine: 1.025 (ref 1.005–1.030)
pH: 6 (ref 5.0–8.0)

## 2019-05-15 MED ORDER — ACETAMINOPHEN 500 MG PO TABS
1000.0000 mg | ORAL_TABLET | Freq: Once | ORAL | Status: AC
  Administered 2019-05-15: 08:00:00 1000 mg via ORAL
  Filled 2019-05-15: qty 2
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
## 2019-05-15 NOTE — Discharge Instructions (Signed)
Abdominal Pain During Pregnancy ° °Belly (abdominal) pain is common during pregnancy. There are many possible causes. Most of the time, it is not a serious problem. Other times, it can be a sign that something is wrong with the pregnancy. Always tell your doctor if you have belly pain. °Follow these instructions at home: °· Do not have sex or put anything in your vagina until your pain goes away completely. °· Get plenty of rest until your pain gets better. °· Drink enough fluid to keep your pee (urine) pale yellow. °· Take over-the-counter and prescription medicines only as told by your doctor. °· Keep all follow-up visits as told by your doctor. This is important. °Contact a doctor if: °· Your pain continues or gets worse after resting. °· You have lower belly pain that: °? Comes and goes at regular times. °? Spreads to your back. °? Feels like menstrual cramps. °· You have pain or burning when you pee (urinate). °Get help right away if: °· You have a fever or chills. °· You have vaginal bleeding. °· You are leaking fluid from your vagina. °· You are passing tissue from your vagina. °· You throw up (vomit) for more than 24 hours. °· You have watery poop (diarrhea) for more than 24 hours. °· Your baby is moving less than usual. °· You feel very weak or faint. °· You have shortness of breath. °· You have very bad pain in your upper belly. °Summary °· Belly (abdominal) pain is common during pregnancy. There are many possible causes. °· If you have belly pain during pregnancy, tell your doctor right away. °· Keep all follow-up visits as told by your doctor. This is important. °This information is not intended to replace advice given to you by your health care provider. Make sure you discuss any questions you have with your health care provider. °Document Released: 06/29/2009 Document Revised: 10/29/2018 Document Reviewed: 10/13/2016 °Elsevier Patient Education © 2020 Elsevier Inc. ° °

## 2019-05-20 ENCOUNTER — Ambulatory Visit (HOSPITAL_COMMUNITY): Payer: Medicaid Other | Admitting: *Deleted

## 2019-05-20 ENCOUNTER — Ambulatory Visit (HOSPITAL_COMMUNITY)
Admission: RE | Admit: 2019-05-20 | Discharge: 2019-05-20 | Disposition: A | Discharge status: Home / Self Care | Payer: Medicaid Other | Source: Ambulatory Visit | Attending: Obstetrics and Gynecology | Admitting: Obstetrics and Gynecology

## 2019-05-20 ENCOUNTER — Other Ambulatory Visit: Payer: Self-pay

## 2019-05-20 ENCOUNTER — Encounter (HOSPITAL_COMMUNITY): Payer: Self-pay | Admitting: *Deleted

## 2019-05-20 DIAGNOSIS — O26892 Other specified pregnancy related conditions, second trimester: Secondary | ICD-10-CM | POA: Diagnosis not present

## 2019-05-20 DIAGNOSIS — Z362 Encounter for other antenatal screening follow-up: Secondary | ICD-10-CM | POA: Insufficient documentation

## 2019-05-20 DIAGNOSIS — Z3400 Encounter for supervision of normal first pregnancy, unspecified trimester: Secondary | ICD-10-CM

## 2019-05-20 DIAGNOSIS — Z3A23 23 weeks gestation of pregnancy: Secondary | ICD-10-CM | POA: Diagnosis not present

## 2019-05-20 DIAGNOSIS — R109 Unspecified abdominal pain: Secondary | ICD-10-CM | POA: Diagnosis not present

## 2019-05-27 ENCOUNTER — Emergency Department (HOSPITAL_COMMUNITY): Admission: EM | Admit: 2019-05-27 | Payer: Medicaid Other | Source: Home / Self Care

## 2019-05-27 ENCOUNTER — Other Ambulatory Visit: Payer: Self-pay

## 2019-05-30 ENCOUNTER — Encounter: Payer: Self-pay | Admitting: Obstetrics and Gynecology

## 2019-05-30 ENCOUNTER — Telehealth (INDEPENDENT_AMBULATORY_CARE_PROVIDER_SITE_OTHER): Payer: Medicaid Other | Admitting: Obstetrics and Gynecology

## 2019-05-30 ENCOUNTER — Other Ambulatory Visit: Payer: Self-pay

## 2019-05-30 DIAGNOSIS — Z3A24 24 weeks gestation of pregnancy: Secondary | ICD-10-CM

## 2019-05-30 DIAGNOSIS — Z3400 Encounter for supervision of normal first pregnancy, unspecified trimester: Secondary | ICD-10-CM

## 2019-05-30 DIAGNOSIS — Z3402 Encounter for supervision of normal first pregnancy, second trimester: Secondary | ICD-10-CM
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
## 2019-05-30 NOTE — Patient Instructions (Signed)
Glucose Tolerance Test During Pregnancy Why am I having this test? The glucose tolerance test (GTT) is done to check how your body processes sugar (glucose). This is one of several tests used to diagnose diabetes that develops during pregnancy (gestational diabetes mellitus). Gestational diabetes is a temporary form of diabetes that some women develop during pregnancy. It usually occurs during the second trimester of pregnancy and goes away after delivery. Testing (screening) for gestational diabetes usually occurs between 24 and 28 weeks of pregnancy. You may have the GTT test after having a 1-hour glucose screening test if the results from that test indicate that you may have gestational diabetes. You may also have this test if:  You have a history of gestational diabetes.  You have a history of giving birth to very large babies or have experienced repeated fetal loss (stillbirth).  You have signs and symptoms of diabetes, such as: ? Changes in your vision. ? Tingling or numbness in your hands or feet. ? Changes in hunger, thirst, and urination that are not otherwise explained by your pregnancy. What is being tested? This test measures the amount of glucose in your blood at different times during a period of 3 hours. This indicates how well your body is able to process glucose. What kind of sample is taken?  Blood samples are required for this test. They are usually collected by inserting a needle into a blood vessel. How do I prepare for this test?  For 3 days before your test, eat normally. Have plenty of carbohydrate-rich foods.  Follow instructions from your health care provider about: ? Eating or drinking restrictions on the day of the test. You may be asked to not eat or drink anything other than water (fast) starting 8-10 hours before the test. ? Changing or stopping your regular medicines. Some medicines may interfere with this test. Tell a health care provider about:  All  medicines you are taking, including vitamins, herbs, eye drops, creams, and over-the-counter medicines.  Any blood disorders you have.  Any surgeries you have had.  Any medical conditions you have. What happens during the test? First, your blood glucose will be measured. This is referred to as your fasting blood glucose, since you fasted before the test. Then, you will drink a glucose solution that contains a certain amount of glucose. Your blood glucose will be measured again 1, 2, and 3 hours after drinking the solution. This test takes about 3 hours to complete. You will need to stay at the testing location during this time. During the testing period:  Do not eat or drink anything other than the glucose solution.  Do not exercise.  Do not use any products that contain nicotine or tobacco, such as cigarettes and e-cigarettes. If you need help stopping, ask your health care provider. The testing procedure may vary among health care providers and hospitals. How are the results reported? Your results will be reported as milligrams of glucose per deciliter of blood (mg/dL) or millimoles per liter (mmol/L). Your health care provider will compare your results to normal ranges that were established after testing a large group of people (reference ranges). Reference ranges may vary among labs and hospitals. For this test, common reference ranges are:  Fasting: less than 95-105 mg/dL (5.3-5.8 mmol/L).  1 hour after drinking glucose: less than 180-190 mg/dL (10.0-10.5 mmol/L).  2 hours after drinking glucose: less than 155-165 mg/dL (8.6-9.2 mmol/L).  3 hours after drinking glucose: 140-145 mg/dL (7.8-8.1 mmol/L). What do the   results mean? Results within reference ranges are considered normal, meaning that your glucose levels are well-controlled. If two or more of your blood glucose levels are high, you may be diagnosed with gestational diabetes. If only one level is high, your health care  provider may suggest repeat testing or other tests to confirm a diagnosis. Talk with your health care provider about what your results mean. Questions to ask your health care provider Ask your health care provider, or the department that is doing the test:  When will my results be ready?  How will I get my results?  What are my treatment options?  What other tests do I need?  What are my next steps? Summary  The glucose tolerance test (GTT) is one of several tests used to diagnose diabetes that develops during pregnancy (gestational diabetes mellitus). Gestational diabetes is a temporary form of diabetes that some women develop during pregnancy.  You may have the GTT test after having a 1-hour glucose screening test if the results from that test indicate that you may have gestational diabetes. You may also have this test if you have any symptoms or risk factors for gestational diabetes.  Talk with your health care provider about what your results mean. This information is not intended to replace advice given to you by your health care provider. Make sure you discuss any questions you have with your health care provider. Document Released: 01/10/2012 Document Revised: 11/01/2018 Document Reviewed: 02/20/2017 Elsevier Patient Education  2020 Elsevier Inc.  

## 2019-06-27 ENCOUNTER — Ambulatory Visit (INDEPENDENT_AMBULATORY_CARE_PROVIDER_SITE_OTHER): Payer: Medicaid Other | Admitting: Obstetrics and Gynecology

## 2019-06-27 ENCOUNTER — Encounter: Payer: Self-pay | Admitting: General Practice

## 2019-06-27 ENCOUNTER — Other Ambulatory Visit: Payer: Self-pay

## 2019-06-27 VITALS — BP 129/80 | HR 101 | Temp 98.2°F | Wt 211.8 lb

## 2019-06-27 DIAGNOSIS — Z3403 Encounter for supervision of normal first pregnancy, third trimester: Secondary | ICD-10-CM

## 2019-06-27 DIAGNOSIS — Z3400 Encounter for supervision of normal first pregnancy, unspecified trimester: Secondary | ICD-10-CM

## 2019-06-27 DIAGNOSIS — Z3A28 28 weeks gestation of pregnancy: Secondary | ICD-10-CM

## 2019-06-27 NOTE — Progress Notes (Signed)
   LOW-RISK PREGNANCY OFFICE VISIT Patient name: Raven Medina MRN [id number]  Date of birth: 2001-04-08 Chief Complaint:   Routine Prenatal Visit  History of Present Illness:   Raven Medina is a 18 y.o. G1P0 female at [redacted]w[redacted]d with an Estimated Date of Delivery: 09/15/19 being seen today for ongoing management of a low-risk pregnancy.  Today she reports backache and some pelvic pressure. Contractions: Not present. Vag. Bleeding: None.  Movement: Present. denies leaking of fluid. Review of Systems:   Pertinent items are noted in HPI Denies abnormal vaginal discharge w/ itching/odor/irritation, headaches, visual changes, shortness of breath, chest pain, abdominal pain, severe nausea/vomiting, or problems with urination or bowel movements unless otherwise stated above. Pertinent History Reviewed:  Reviewed past medical,surgical, social, obstetrical and family history.  Reviewed problem list, medications and allergies. Physical Assessment:   Vitals:   06/27/19 0829  BP: 129/80  Pulse: (!) 101  Temp: 98.2 F (36.8 C)  Weight: 211 lb 12.8 oz (96.1 kg)  Body mass index is 35.25 kg/m.        Physical Examination:   General appearance: Well appearing, and in no distress  Mental status: Alert, oriented to person, place, and time  Skin: Warm & dry  Cardiovascular: Normal heart rate noted  Respiratory: Normal respiratory effort, no distress  Abdomen: Soft, gravid, nontender  Pelvic: Cervical exam deferred         Extremities: Edema: None  Fetal Status: Fetal Heart Rate (bpm): 150 Fundal Height: 31 cm Movement: Present      Assessment & Plan:  1) Low-risk pregnancy G1P0 at [redacted]w[redacted]d with an Estimated Date of Delivery: 09/15/19   2) Supervision of normal first pregnancy, antepartum  - Glucose Tolerance, 2 Hours w/1 Hour,  - HIV antibody (with reflex),  - RPR,  - CBC - Declined TdaP and Flu vaccine   Meds: No orders of the defined types were placed in this encounter.   Labs/procedures today: 2 hr GTT and 3rd trimester labs  Plan:  Continue routine obstetrical care   Reviewed: Preterm labor symptoms and general obstetric precautions including but not limited to vaginal bleeding, contractions, leaking of fluid and fetal movement were reviewed in detail with the patient.  All questions were answered. Has home bp cuff. Check bp weekly, let us know if >140/90.   Follow-up: Return in about 4 weeks (around 07/25/2019) for Return OB - My Chart video.  Orders Placed This Encounter  Procedures  . Glucose Tolerance, 2 Hours w/1 Hour  . HIV antibody (with reflex)  . RPR  . CBC   Laury Deep MSN, CNM 06/27/2019

## 2019-06-28 ENCOUNTER — Encounter: Payer: Self-pay | Admitting: Obstetrics and Gynecology

## 2019-06-28 LAB — HIV ANTIBODY (ROUTINE TESTING W REFLEX): HIV Screen 4th Generation wRfx: NONREACTIVE

## 2019-06-28 LAB — CBC
Hematocrit: 30.7 % — ABNORMAL LOW (ref 34.0–46.6)
Hemoglobin: 10 g/dL — ABNORMAL LOW (ref 11.1–15.9)
MCH: 25.8 pg — ABNORMAL LOW (ref 26.6–33.0)
MCHC: 32.6 g/dL (ref 31.5–35.7)
MCV: 79 fL (ref 79–97)
Platelets: 202 10*3/uL (ref 150–450)
RBC: 3.87 x10E6/uL (ref 3.77–5.28)
RDW: 13.4 % (ref 11.7–15.4)
WBC: 7.3 10*3/uL (ref 3.4–10.8)

## 2019-06-28 LAB — GLUCOSE TOLERANCE, 2 HOURS W/ 1HR
Glucose, 1 hour: 117 mg/dL (ref 65–179)
Glucose, 2 hour: 91 mg/dL (ref 65–152)
Glucose, Fasting: 75 mg/dL (ref 65–91)

## 2019-06-28 LAB — RPR: RPR Ser Ql: NONREACTIVE

## 2019-06-28 NOTE — Patient Instructions (Signed)
Fetal Movement Counts Patient Name: ________________________________________________ Patient Due Date: ____________________ What is a fetal movement count?  A fetal movement count is the number of times that you feel your baby move during a certain amount of time. This may also be called a fetal kick count. A fetal movement count is recommended for every pregnant woman. You may be asked to start counting fetal movements as early as week 28 of your pregnancy. Pay attention to when your baby is most active. You may notice your baby's sleep and wake cycles. You may also notice things that make your baby move more. You should do a fetal movement count:  When your baby is normally most active.  At the same time each day. A good time to count movements is while you are resting, after having something to eat and drink. How do I count fetal movements? 1. Find a quiet, comfortable area. Sit, or lie down on your side. 2. Write down the date, the start time and stop time, and the number of movements that you felt between those two times. Take this information with you to your health care visits. 3. For 2 hours, count kicks, flutters, swishes, rolls, and jabs. You should feel at least 10 movements during 2 hours. 4. You may stop counting after you have felt 10 movements. 5. If you do not feel 10 movements in 2 hours, have something to eat and drink. Then, keep resting and counting for 1 hour. If you feel at least 4 movements during that hour, you may stop counting. Contact a health care provider if:  You feel fewer than 4 movements in 2 hours.  Your baby is not moving like he or she usually does. Date: ____________ Start time: ____________ Stop time: ____________ Movements: ____________ Date: ____________ Start time: ____________ Stop time: ____________ Movements: ____________ Date: ____________ Start time: ____________ Stop time: ____________ Movements: ____________ Date: ____________ Start time:  ____________ Stop time: ____________ Movements: ____________ Date: ____________ Start time: ____________ Stop time: ____________ Movements: ____________ Date: ____________ Start time: ____________ Stop time: ____________ Movements: ____________ Date: ____________ Start time: ____________ Stop time: ____________ Movements: ____________ Date: ____________ Start time: ____________ Stop time: ____________ Movements: ____________ Date: ____________ Start time: ____________ Stop time: ____________ Movements: ____________ This information is not intended to replace advice given to you by your health care provider. Make sure you discuss any questions you have with your health care provider. Document Released: 08/10/2006 Document Revised: 07/31/2018 Document Reviewed: 08/20/2015 Elsevier Patient Education  [place]. Iron-Rich Diet  Iron is a mineral that helps your body to produce hemoglobin. Hemoglobin is a protein in red blood cells that carries oxygen to your body's tissues. Eating too little iron may cause you to feel weak and tired, and it can increase your risk of infection. Iron is naturally found in many foods, and many foods have iron added to them (iron-fortified foods). You may need to follow an iron-rich diet if you do not have enough iron in your body due to certain medical conditions. The amount of iron that you need each day depends on your age, your sex, and any medical conditions you have. Follow instructions from your health care provider or a diet and nutrition specialist (dietitian) about how much iron you should eat each day. What are tips for following this plan? Reading food labels  Check food labels to see how many milligrams (mg) of iron are in each serving. Cooking  Cook foods in pots and pans that are made  from iron.  Take these steps to make it easier for your body to absorb iron from certain foods: ? Soak beans overnight before cooking. ? Soak whole grains  overnight and drain them before using. ? Ferment flours before baking, such as by using yeast in bread dough. Meal planning  When you eat foods that contain iron, you should eat them with foods that are high in vitamin C. These include oranges, peppers, tomatoes, potatoes, and mango. Vitamin C helps your body to absorb iron. General information  Take iron supplements only as told by your health care provider. An overdose of iron can be life-threatening. If you were prescribed iron supplements, take them with orange juice or a vitamin C supplement.  When you eat iron-fortified foods or take an iron supplement, you should also eat foods that naturally contain iron, such as meat, poultry, and fish. Eating naturally iron-rich foods helps your body to absorb the iron that is added to other foods or contained in a supplement.  Certain foods and drinks prevent your body from absorbing iron properly. Avoid eating these foods in the same meal as iron-rich foods or with iron supplements. These foods include: ? Coffee, black tea, and red wine. ? Milk, dairy products, and foods that are high in calcium. ? Beans and soybeans. ? Whole grains. What foods should I eat? Fruits Prunes. Raisins. Eat fruits high in vitamin C, such as oranges, grapefruits, and strawberries, alongside iron-rich foods. Vegetables Spinach (cooked). Green peas. Broccoli. Fermented vegetables. Eat vegetables high in vitamin C, such as leafy greens, potatoes, bell peppers, and tomatoes, alongside iron-rich foods. Grains Iron-fortified breakfast cereal. Iron-fortified whole-wheat bread. Enriched rice. Sprouted grains. Meats and other proteins Beef liver. Oysters. Beef. Shrimp. [place]. Chicken. [place]. Sardines. Chickpeas. Nuts. Tofu. Pumpkin seeds. Beverages Tomato juice. Fresh orange juice. Prune juice. Hibiscus tea. Fortified instant breakfast shakes. Sweets and desserts Blackstrap molasses. Seasonings and condiments Tahini.  Fermented soy sauce. Other foods Wheat germ. The items listed above may not be a complete list of recommended foods and beverages. Contact a dietitian for more information. What foods should I avoid? Grains Whole grains. Bran cereal. Bran flour. Oats. Meats and other proteins Soybeans. Products made from soy protein. Black beans. Lentils. Mung beans. Split peas. Dairy Milk. Cream. Cheese. Yogurt. Cottage cheese. Beverages Coffee. Black tea. Red wine. Sweets and desserts Cocoa. Chocolate. Ice cream. Other foods Basil. Oregano. Large amounts of parsley. The items listed above may not be a complete list of foods and beverages to avoid. Contact a dietitian for more information. Summary  Iron is a mineral that helps your body to produce hemoglobin. Hemoglobin is a protein in red blood cells that carries oxygen to your body's tissues.  Iron is naturally found in many foods, and many foods have iron added to them (iron-fortified foods).  When you eat foods that contain iron, you should eat them with foods that are high in vitamin C. Vitamin C helps your body to absorb iron.  Certain foods and drinks prevent your body from absorbing iron properly, such as whole grains and dairy products. You should avoid eating these foods in the same meal as iron-rich foods or with iron supplements. This information is not intended to replace advice given to you by your health care provider. Make sure you discuss any questions you have with your health care provider. Document Released: 02/22/2005 Document Revised: 06/23/2017 Document Reviewed: 06/06/2017 Elsevier Patient Education  2020 [employer].

## 2019-07-09 ENCOUNTER — Other Ambulatory Visit: Payer: Self-pay | Admitting: Obstetrics and Gynecology

## 2019-07-09 DIAGNOSIS — O99013 Anemia complicating pregnancy, third trimester: Secondary | ICD-10-CM

## 2019-07-09 MED ORDER — FERROUS SULFATE 325 (65 FE) MG PO TBEC: 325.0000 mg | DELAYED_RELEASE_TABLET | Freq: Two times a day (BID) | ORAL | 2 refills | Status: DC

## 2019-07-09 NOTE — Progress Notes (Unsigned)
Patient notified via My Chart  Ibraham Levi, CNM  

## 2019-07-21 ENCOUNTER — Inpatient Hospital Stay (HOSPITAL_COMMUNITY)
Admission: AD | Admit: 2019-07-21 | Discharge: 2019-07-21 | Disposition: A | Discharge status: Home / Self Care | Payer: Medicaid Other | Attending: Family Medicine | Admitting: Family Medicine

## 2019-07-21 ENCOUNTER — Encounter (HOSPITAL_COMMUNITY): Payer: Self-pay | Admitting: Family Medicine

## 2019-07-21 ENCOUNTER — Other Ambulatory Visit: Payer: Self-pay

## 2019-07-21 DIAGNOSIS — O9A213 Injury, poisoning and certain other consequences of external causes complicating pregnancy, third trimester: Secondary | ICD-10-CM | POA: Insufficient documentation

## 2019-07-21 DIAGNOSIS — Z3A32 32 weeks gestation of pregnancy: Secondary | ICD-10-CM | POA: Insufficient documentation

## 2019-07-21 DIAGNOSIS — Z87891 Personal history of nicotine dependence: Secondary | ICD-10-CM | POA: Insufficient documentation

## 2019-07-21 DIAGNOSIS — Y92410 Unspecified street and highway as the place of occurrence of the external cause: Secondary | ICD-10-CM | POA: Diagnosis not present

## 2019-07-21 DIAGNOSIS — R102 Pelvic and perineal pain: Secondary | ICD-10-CM | POA: Insufficient documentation

## 2019-07-21 DIAGNOSIS — Z3689 Encounter for other specified antenatal screening: Secondary | ICD-10-CM

## 2019-07-21 LAB — URINALYSIS, ROUTINE W REFLEX MICROSCOPIC
Bilirubin Urine: NEGATIVE
Glucose, UA: NEGATIVE mg/dL
Hgb urine dipstick: NEGATIVE
Ketones, ur: NEGATIVE mg/dL
Leukocytes,Ua: NEGATIVE
Nitrite: NEGATIVE
Protein, ur: NEGATIVE mg/dL
Specific Gravity, Urine: 1.025 (ref 1.005–1.030)
pH: 6 (ref 5.0–8.0)

## 2019-07-21 MED ORDER — CYCLOBENZAPRINE HCL 10 MG PO TABS: 10.0000 mg | ORAL_TABLET | Freq: Every day | ORAL | 0 refills | Status: DC
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
## 2019-07-21 NOTE — Discharge Instructions (Signed)
Return to MAU:  If you have heavy bleeding that soaks through more that 2 pads per hour for an hour or more  If you bleed so much that you feel like you might pass out or you do pass out  If you have significant abdominal pain that is not improved with Tylenol 1000 mg  If you develop a fever > 100.5  Decreased or no Fetal Movement

## 2019-07-22 ENCOUNTER — Telehealth: Payer: Self-pay | Admitting: General Practice

## 2019-07-22 NOTE — Telephone Encounter (Signed)
Patient aware of appointment (07/24/2019 @ 2:50pm) being changed to in office due to recent visit to MAU.

## 2019-07-24 ENCOUNTER — Encounter: Payer: Medicaid Other | Admitting: Advanced Practice Midwife

## 2019-07-26 NOTE — L&D Delivery Note (Signed)
OB/GYN Faculty Practice Delivery Note  Raven Medina is a 19 y.o. G1P0 s/p VD at [redacted]w[redacted]d. She was admitted for PD IOL.   ROM: 5h 43m with clear fluid GBS Status: Positive/-- (01/27 1334)  Labor Progress: . Initial SVE: 5/60/-3. Patient had received Foley bulb outpatient which was removed on admission. SROM occurred just prior to admission. Received Pitocin and epidural. She then progressed to complete.   Delivery Date/Time: 2/28 @ 0522 Delivery: Called to room and patient was complete and pushing. Head delivered in LOA position. Loose nuchal cord present and reduced. Short shoulder dystocia resolved in 10 seconds with McRoberts and suprapubic pressure. Body delivered in usual fashion. Infant with spontaneous cry, placed on mother's abdomen, dried and stimulated. Cord clamped x 2 after 1-minute delay, and cut by FOB. Cord blood drawn. Placenta delivered spontaneously with gentle cord traction. Fundus firm with massage and Pitocin. Labia, perineum, vagina, and cervix inspected inspected with no lacerations.  Baby Weight: pending  Placenta: Sent to L&D Complications: None Lacerations: None EBL: 125 mL Analgesia: Epidural   Infant: APGAR (1 MIN): 8 APGAR (5 MINS): 9  APGAR (10 MINS):     Jerilynn Birkenhead, MD [facility] Family Medicine Fellow, [facility] for [facility], [facility] 09/22/2019, 5:35 AM

## 2019-08-21 ENCOUNTER — Other Ambulatory Visit (HOSPITAL_COMMUNITY)
Admission: RE | Admit: 2019-08-21 | Discharge: 2019-08-21 | Disposition: A | Discharge status: Home / Self Care | Payer: Medicaid Other | Source: Ambulatory Visit | Attending: Advanced Practice Midwife | Admitting: Advanced Practice Midwife

## 2019-08-21 ENCOUNTER — Ambulatory Visit (INDEPENDENT_AMBULATORY_CARE_PROVIDER_SITE_OTHER): Payer: Medicaid Other | Admitting: Advanced Practice Midwife

## 2019-08-21 ENCOUNTER — Other Ambulatory Visit: Payer: Self-pay

## 2019-08-21 ENCOUNTER — Encounter: Payer: Self-pay | Admitting: General Practice

## 2019-08-21 ENCOUNTER — Encounter: Payer: Self-pay | Admitting: Advanced Practice Midwife

## 2019-08-21 VITALS — BP 120/76 | HR 88 | Temp 97.7°F | Wt 233.0 lb

## 2019-08-21 DIAGNOSIS — Z3403 Encounter for supervision of normal first pregnancy, third trimester: Secondary | ICD-10-CM | POA: Insufficient documentation

## 2019-08-21 DIAGNOSIS — Z3A36 36 weeks gestation of pregnancy: Secondary | ICD-10-CM
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
## 2019-08-22 ENCOUNTER — Encounter: Payer: Self-pay | Admitting: Advanced Practice Midwife

## 2019-08-22 LAB — CERVICOVAGINAL ANCILLARY ONLY
Chlamydia: NEGATIVE
Comment: NEGATIVE
Comment: NORMAL
Neisseria Gonorrhea: NEGATIVE

## 2019-08-24 LAB — CULTURE, BETA STREP (GROUP B ONLY): Strep Gp B Culture: POSITIVE — AB

## 2019-08-29 ENCOUNTER — Other Ambulatory Visit: Payer: Self-pay

## 2019-08-29 ENCOUNTER — Telehealth (INDEPENDENT_AMBULATORY_CARE_PROVIDER_SITE_OTHER): Payer: Medicaid Other | Admitting: Obstetrics and Gynecology

## 2019-08-29 DIAGNOSIS — Z3403 Encounter for supervision of normal first pregnancy, third trimester: Secondary | ICD-10-CM

## 2019-08-29 DIAGNOSIS — Z3A37 37 weeks gestation of pregnancy: Secondary | ICD-10-CM

## 2019-08-29 DIAGNOSIS — Z3400 Encounter for supervision of normal first pregnancy, unspecified trimester: Secondary | ICD-10-CM
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
## 2019-08-30 ENCOUNTER — Encounter: Payer: Self-pay | Admitting: Obstetrics and Gynecology

## 2019-09-04 ENCOUNTER — Encounter: Payer: Self-pay | Admitting: Advanced Practice Midwife

## 2019-09-04 ENCOUNTER — Other Ambulatory Visit: Payer: Self-pay

## 2019-09-04 ENCOUNTER — Telehealth (INDEPENDENT_AMBULATORY_CARE_PROVIDER_SITE_OTHER): Payer: Medicaid Other | Admitting: Advanced Practice Midwife

## 2019-09-04 DIAGNOSIS — Z3689 Encounter for other specified antenatal screening: Secondary | ICD-10-CM | POA: Diagnosis not present

## 2019-09-04 DIAGNOSIS — Z3A38 38 weeks gestation of pregnancy: Secondary | ICD-10-CM | POA: Diagnosis not present

## 2019-09-04 DIAGNOSIS — O9A213 Injury, poisoning and certain other consequences of external causes complicating pregnancy, third trimester: Secondary | ICD-10-CM

## 2019-09-04 DIAGNOSIS — Z3400 Encounter for supervision of normal first pregnancy, unspecified trimester: Secondary | ICD-10-CM
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
## 2019-09-04 NOTE — Addendum Note (Signed)
Addended by: Thressa Sheller D on: 09/04/2019 02:32 PM   Modules accepted: Orders

## 2019-09-10 ENCOUNTER — Other Ambulatory Visit: Payer: Self-pay

## 2019-09-10 ENCOUNTER — Inpatient Hospital Stay (HOSPITAL_COMMUNITY)
Admission: AD | Admit: 2019-09-10 | Discharge: 2019-09-10 | Disposition: A | Discharge status: Home / Self Care | Payer: Medicaid Other | Attending: Obstetrics and Gynecology | Admitting: Obstetrics and Gynecology

## 2019-09-10 ENCOUNTER — Encounter (HOSPITAL_COMMUNITY): Payer: Self-pay | Admitting: Obstetrics and Gynecology

## 2019-09-10 DIAGNOSIS — Z0371 Encounter for suspected problem with amniotic cavity and membrane ruled out: Secondary | ICD-10-CM

## 2019-09-10 DIAGNOSIS — Z3A39 39 weeks gestation of pregnancy: Secondary | ICD-10-CM

## 2019-09-10 LAB — POCT FERN TEST
POCT Fern Test: NEGATIVE
POCT Fern Test: NEGATIVE

## 2019-09-10 NOTE — MAU Note (Signed)
Pt presents to MAU with c/o PROM. Felt small amount of leaking of fluid this morning around 0545, none since then. She has also had some cramping this morning in the "vaginal" area. Denies bleeding. +FM

## 2019-09-10 NOTE — MAU Provider Note (Signed)
First Provider Initiated Contact with Patient 09/10/19 [phone]       S: Ms. Raven Medina is a 19 y.o. G1P0 at [redacted]w[redacted]d  who presents to MAU today complaining of leaking of fluid since this morning. She denies vaginal bleeding. She endorses occasional cramping. She reports normal fetal movement. Last intercourse was last night.   O: BP 129/74   Pulse 88   Temp 98.2 F (36.8 C) (Oral)   Resp 16   Ht 5\' 5"  (1.651 m)   Wt 103.4 kg   LMP 12/09/2018 (Exact Date)   SpO2 100%   BMI 37.94 kg/m  GENERAL: Well-developed, well-nourished female in no acute distress.  HEAD: Normocephalic, atraumatic.  CHEST: Normal effort of breathing, regular heart rate ABDOMEN: Soft, nontender, gravid PELVIC: Normal external female genitalia. Vagina is pink and rugated. Cervix with normal contour, no lesions. Normal discharge.  No pooling.   Cervical exam:      Fetal Monitoring: Baseline: 135 Variability: moderate Accelerations: 15x15 Decelerations: none Contractions: none  Results for orders placed or performed during the hospital encounter of 09/10/19 (from the past 24 hour(s))  POCT fern test     Status: None   Collection Time: 09/10/19  8:32 AM  Result Value Ref Range   POCT Fern Test Negative = intact amniotic membranes      A: SIUP at [redacted]w[redacted]d  Membranes intact  P: discharge home  Keep ob appointment tomorrow Labor precautions & fetal kick counts education provided  [redacted]w[redacted]d, NP 09/10/2019 8:47 AM

## 2019-09-10 NOTE — Discharge Instructions (Signed)
Fetal Movement Counts Patient Name: ________________________________________________ Patient Due Date: ____________________ What is a fetal movement count?  A fetal movement count is the number of times that you feel your baby move during a certain amount of time. This may also be called a fetal kick count. A fetal movement count is recommended for every pregnant woman. You may be asked to start counting fetal movements as early as week 28 of your pregnancy. Pay attention to when your baby is most active. You may notice your baby's sleep and wake cycles. You may also notice things that make your baby move more. You should do a fetal movement count:  When your baby is normally most active.  At the same time each day. A good time to count movements is while you are resting, after having something to eat and drink. How do I count fetal movements? 1. Find a quiet, comfortable area. Sit, or lie down on your side. 2. Write down the date, the start time and stop time, and the number of movements that you felt between those two times. Take this information with you to your health care visits. 3. Write down your start time when you feel the first movement. 4. Count kicks, flutters, swishes, rolls, and jabs. You should feel at least 10 movements. 5. You may stop counting after you have felt 10 movements, or if you have been counting for 2 hours. Write down the stop time. 6. If you do not feel 10 movements in 2 hours, contact your health care provider for further instructions. Your health care provider may want to do additional tests to assess your baby's well-being. Contact a health care provider if:  You feel fewer than 10 movements in 2 hours.  Your baby is not moving like he or she usually does. Date: ____________ Start time: ____________ Stop time: ____________ Movements: ____________ Date: ____________ Start time: ____________ Stop time: ____________ Movements: ____________ Date: ____________  Start time: ____________ Stop time: ____________ Movements: ____________ Date: ____________ Start time: ____________ Stop time: ____________ Movements: ____________ Date: ____________ Start time: ____________ Stop time: ____________ Movements: ____________ Date: ____________ Start time: ____________ Stop time: ____________ Movements: ____________ Date: ____________ Start time: ____________ Stop time: ____________ Movements: ____________ Date: ____________ Start time: ____________ Stop time: ____________ Movements: ____________ Date: ____________ Start time: ____________ Stop time: ____________ Movements: ____________ This information is not intended to replace advice given to you by your health care provider. Make sure you discuss any questions you have with your health care provider. Document Revised: 02/28/2019 Document Reviewed: 02/28/2019 Elsevier Patient Education  2020 Else[employer]        Signs and Symptoms of Labor Labor is your body's natural process of moving your baby, placenta, and umbilical cord out of your uterus. The process of labor usually starts when your baby is full-term, between 37 and 40 weeks of pregnancy. How will I know when I am close to going into labor? As your body prepares for labor and the birth of your baby, you may notice the following symptoms in the weeks and days before true labor starts:  Having a strong desire to get your home ready to receive your new baby. This is called nesting. Nesting may be a sign that labor is approaching, and it may occur several weeks before birth. Nesting may involve cleaning and organizing your home.  Passing a small amount of thick, bloody mucus out of your vagina (normal bloody show or losing your mucus plug). This may happen more than a   week before labor begins, or it might occur right before labor begins as the opening of the cervix starts to widen (dilate). For some women, the entire mucus plug passes at once. For others,  smaller portions of the mucus plug may gradually pass over several days.  Your baby moving (dropping) lower in your pelvis to get into position for birth (lightening). When this happens, you may feel more pressure on your bladder and pelvic bone and less pressure on your ribs. This may make it easier to breathe. It may also cause you to need to urinate more often and have problems with bowel movements.  Having "practice contractions" (Braxton Hicks contractions) that occur at irregular (unevenly spaced) intervals that are more than 10 minutes apart. This is also called false labor. False labor contractions are common after exercise or sexual activity, and they will stop if you change position, rest, or drink fluids. These contractions are usually mild and do not get stronger over time. They may feel like: ? A backache or back pain. ? Mild cramps, similar to menstrual cramps. ? Tightening or pressure in your abdomen. Other early symptoms that labor may be starting soon include:  Nausea or loss of appetite.  Diarrhea.  Having a sudden burst of energy, or feeling very tired.  Mood changes.  Having trouble sleeping. How will I know when labor has begun? Signs that true labor has begun may include:  Having contractions that come at regular (evenly spaced) intervals and increase in intensity. This may feel like more intense tightening or pressure in your abdomen that moves to your back. ? Contractions may also feel like rhythmic pain in your upper thighs or back that comes and goes at regular intervals. ? For first-time mothers, this change in intensity of contractions often occurs at a more gradual pace. ? Women who have given birth before may notice a more rapid progression of contraction changes.  Having a feeling of pressure in the vaginal area.  Your water breaking (rupture of membranes). This is when the sac of fluid that surrounds your baby breaks. When this happens, you will notice  fluid leaking from your vagina. This may be clear or blood-tinged. Labor usually starts within 24 hours of your water breaking, but it may take longer to begin. ? Some women notice this as a gush of fluid. ? Others notice that their underwear repeatedly becomes damp. Follow these instructions at home:   When labor starts, or if your water breaks, call your health care provider or nurse care line. Based on your situation, they will determine when you should go in for an exam.  When you are in early labor, you may be able to rest and manage symptoms at home. Some strategies to try at home include: ? Breathing and relaxation techniques. ? Taking a warm bath or shower. ? Listening to music. ? Using a heating pad on the lower back for pain. If you are directed to use heat:  Place a towel between your skin and the heat source.  Leave the heat on for 20-30 minutes.  Remove the heat if your skin turns bright red. This is especially important if you are unable to feel pain, heat, or cold. You may have a greater risk of getting burned. Get help right away if:  You have painful, regular contractions that are 5 minutes apart or less.  Labor starts before you are [redacted] weeks along in your pregnancy.  You have a fever.  You have   a headache that does not go away.  You have bright red blood coming from your vagina.  You do not feel your baby moving.  You have a sudden onset of: ? Severe headache with vision problems. ? Nausea, vomiting, or diarrhea. ? Chest pain or shortness of breath. These symptoms may be an emergency. If your health care provider recommends that you go to the hospital or birth center where you plan to deliver, do not drive yourself. Have someone else drive you, or call emergency services (911 in the [place] Summary  Labor is your body's natural process of moving your baby, placenta, and umbilical cord out of your uterus.  The process of labor usually starts when your baby is  full-term, between 37 and 40 weeks of pregnancy.  When labor starts, or if your water breaks, call your health care provider or nurse care line. Based on your situation, they will determine when you should go in for an exam. This information is not intended to replace advice given to you by your health care provider. Make sure you discuss any questions you have with your health care provider. Document Revised: 04/10/2017 Document Reviewed: 12/16/2016 Elsevier Patient Education  2020 Elsevier Inc.  

## 2019-09-11 ENCOUNTER — Other Ambulatory Visit: Payer: Self-pay

## 2019-09-11 ENCOUNTER — Ambulatory Visit (INDEPENDENT_AMBULATORY_CARE_PROVIDER_SITE_OTHER): Payer: Medicaid Other | Admitting: Certified Nurse Midwife

## 2019-09-11 ENCOUNTER — Encounter: Payer: Self-pay | Admitting: *Deleted

## 2019-09-11 ENCOUNTER — Encounter: Payer: Self-pay | Admitting: Certified Nurse Midwife

## 2019-09-11 VITALS — BP 128/79 | HR 90 | Temp 97.8°F | Wt 226.8 lb

## 2019-09-11 DIAGNOSIS — B951 Streptococcus, group B, as the cause of diseases classified elsewhere: Secondary | ICD-10-CM

## 2019-09-11 DIAGNOSIS — Z3400 Encounter for supervision of normal first pregnancy, unspecified trimester: Secondary | ICD-10-CM

## 2019-09-11 DIAGNOSIS — O99011 Anemia complicating pregnancy, first trimester: Secondary | ICD-10-CM

## 2019-09-11 DIAGNOSIS — O99013 Anemia complicating pregnancy, third trimester: Secondary | ICD-10-CM

## 2019-09-11 DIAGNOSIS — Z3A39 39 weeks gestation of pregnancy: Secondary | ICD-10-CM

## 2019-09-11 NOTE — Patient Instructions (Signed)
Reasons to go to MAU:  1.  Contractions are  5 minutes apart or less, each last 1 minute, these have been going on for 1-2 hours, and you cannot walk or talk during them 2.  You have a large gush of fluid, or a trickle of fluid that will not stop and you have to wear a pad 3.  You have bleeding that is bright red, heavier than spotting--like menstrual bleeding (spotting can be normal in early labor or after a check of your cervix) 4.  You do not feel the baby moving like he/she normally does  

## 2019-09-11 NOTE — Progress Notes (Signed)
   PRENATAL VISIT NOTE  Subjective:  Raven Medina is a 19 y.o. G1P0 at [redacted]w[redacted]d being seen today for ongoing prenatal care.  She is currently monitored for the following issues for this low-risk pregnancy and has ADHD (attention deficit hyperactivity disorder); Adjustment disorder with anxiety; Acne vulgaris; Supervision of normal first pregnancy, antepartum; Asthma affecting pregnancy in first trimester; Anemia in pregnancy, first trimester; Blunt abdominal trauma; Traumatic injury during pregnancy in third trimester; NST (non-stress test) reactive on fetal surveillance; and Positive GBS test on their problem list.  Patient reports no complaints.  Contractions: Not present. Vag. Bleeding: None.  Movement: Present. Denies leaking of fluid.   The following portions of the patient's history were reviewed and updated as appropriate: allergies, current medications, past family history, past medical history, past social history, past surgical history and problem list.   Objective:   Vitals:   09/11/19 1552  BP: 128/79  Pulse: 90  Temp: 97.8 F (36.6 C)  Weight: 226 lb 12.8 oz (102.9 kg)    Fetal Status: Fetal Heart Rate (bpm): 136   Movement: Present     General:  Alert, oriented and cooperative. Patient is in no acute distress.  Skin: Skin is warm and dry. No rash noted.   Cardiovascular: Normal heart rate noted  Respiratory: Normal respiratory effort, no problems with respiration noted  Abdomen: Soft, gravid, appropriate for gestational age.  Pain/Pressure: Present     Pelvic: Cervical exam deferred        Extremities: Normal range of motion.  Edema: None  Mental Status: Normal mood and affect. Normal behavior. Normal judgment and thought content.   Assessment and Plan:  Pregnancy: G1P0 at [redacted]w[redacted]d 1. Supervision of normal first pregnancy, antepartum - patient doing well, no complaints - Reports she is not doing anything at home to stimulate contractions, wants baby to come "when she  is ready"  - Routine prenatal care - Anticipatory guidance on upcoming appointments. Discussed with patient post dates antenatal screening to be started next week.  - Discussed with patient that induction will be scheduled for 2/28 if she does not deliver prior  - Induction scheduled and orders placed   2. Anemia in pregnancy, first trimester - continue OTC iron supplementation   3. Positive GBS test - treat in labor   Term labor symptoms and general obstetric precautions including but not limited to vaginal bleeding, contractions, leaking of fluid and fetal movement were reviewed in detail with the patient. Please refer to After Visit Summary for other counseling recommendations.   Return in about 6 days (around 09/17/2019) for ROB.  Future Appointments  Date Time Provider Department Center  09/18/2019  3:15 PM [facility] NST [facility]    Sharyon Cable, [place]

## 2019-09-13 ENCOUNTER — Encounter (HOSPITAL_COMMUNITY): Payer: Self-pay | Admitting: *Deleted

## 2019-09-13 ENCOUNTER — Telehealth (HOSPITAL_COMMUNITY): Payer: Self-pay | Admitting: *Deleted

## 2019-09-13 NOTE — Telephone Encounter (Signed)
Preadmission screen  

## 2019-09-16 ENCOUNTER — Other Ambulatory Visit (HOSPITAL_COMMUNITY): Payer: Self-pay | Admitting: Advanced Practice Midwife

## 2019-09-16 ENCOUNTER — Encounter: Payer: Self-pay | Admitting: General Practice

## 2019-09-17 ENCOUNTER — Other Ambulatory Visit: Payer: Self-pay | Admitting: Advanced Practice Midwife

## 2019-09-18 ENCOUNTER — Other Ambulatory Visit: Payer: Self-pay

## 2019-09-18 ENCOUNTER — Ambulatory Visit (INDEPENDENT_AMBULATORY_CARE_PROVIDER_SITE_OTHER): Payer: Medicaid Other | Admitting: Certified Nurse Midwife

## 2019-09-18 ENCOUNTER — Encounter: Payer: Self-pay | Admitting: General Practice

## 2019-09-18 ENCOUNTER — Ambulatory Visit: Payer: Self-pay

## 2019-09-18 ENCOUNTER — Ambulatory Visit (INDEPENDENT_AMBULATORY_CARE_PROVIDER_SITE_OTHER): Payer: Medicaid Other | Admitting: *Deleted

## 2019-09-18 VITALS — BP 124/87 | HR 99 | Temp 98.3°F | Wt 228.4 lb

## 2019-09-18 VITALS — BP 126/75 | HR 88 | Wt 227.4 lb

## 2019-09-18 DIAGNOSIS — O480 Post-term pregnancy: Secondary | ICD-10-CM

## 2019-09-18 DIAGNOSIS — O99820 Streptococcus B carrier state complicating pregnancy: Secondary | ICD-10-CM

## 2019-09-18 DIAGNOSIS — Z3A40 40 weeks gestation of pregnancy: Secondary | ICD-10-CM | POA: Diagnosis not present

## 2019-09-18 DIAGNOSIS — B951 Streptococcus, group B, as the cause of diseases classified elsewhere: Secondary | ICD-10-CM

## 2019-09-18 DIAGNOSIS — Z3400 Encounter for supervision of normal first pregnancy, unspecified trimester: Secondary | ICD-10-CM

## 2019-09-18 DIAGNOSIS — O99011 Anemia complicating pregnancy, first trimester: Secondary | ICD-10-CM

## 2019-09-18 NOTE — Progress Notes (Signed)
   PRENATAL VISIT NOTE  Subjective:  Raven Medina is a 19 y.o. G1P0 at [redacted]w[redacted]d being seen today for ongoing prenatal care.  She is currently monitored for the following issues for this low-risk pregnancy and has ADHD (attention deficit hyperactivity disorder); Adjustment disorder with anxiety; Acne vulgaris; Supervision of normal first pregnancy, antepartum; Asthma affecting pregnancy in first trimester; Anemia in pregnancy, antepartum; Blunt abdominal trauma; Traumatic injury during pregnancy in third trimester; NST (non-stress test) reactive on fetal surveillance; and Positive GBS test on their problem list.  Patient reports no complaints.  Contractions: Not present. Vag. Bleeding: None.  Movement: Present. Denies leaking of fluid.   The following portions of the patient's history were reviewed and updated as appropriate: allergies, current medications, past family history, past medical history, past social history, past surgical history and problem list.   Objective:   Vitals:   09/18/19 1359  BP: 124/87  Pulse: 99  Temp: 98.3 F (36.8 C)  Weight: 228 lb 6.4 oz (103.6 kg)    Fetal Status: Fetal Heart Rate (bpm): 133 Fundal Height: 37 cm Movement: Present     General:  Alert, oriented and cooperative. Patient is in no acute distress.  Skin: Skin is warm and dry. No rash noted.   Cardiovascular: Normal heart rate noted  Respiratory: Normal respiratory effort, no problems with respiration noted  Abdomen: Soft, gravid, appropriate for gestational age.  Pain/Pressure: Present     Pelvic: Cervical exam deferred      Patient declined cervical examination   Extremities: Normal range of motion.  Edema: None  Mental Status: Normal mood and affect. Normal behavior. Normal judgment and thought content.   Assessment and Plan:  Pregnancy: G1P0 at [redacted]w[redacted]d 1. Supervision of normal first pregnancy, antepartum - Patient doing well, no complaints  - patient denies contractions at this time  -  Educated and discussed IOL process, discussed with patient outpatient FB prior to induction  - Patient declines cervical examination today, last cervical examination patient was closed  - Patient to come to MAU on Saturday 2/27 @ 1400 for outpatient FB placement then return for induction @ 23:45 for midnight induction, patient agrees to plan of care  - BPP and NST scheduled at [facility] this afternoon   2. Anemia in pregnancy, antepartum - Continue iron supplementation   3. Positive GBS test - treat in labor   Term labor symptoms and general obstetric precautions including but not limited to vaginal bleeding, contractions, leaking of fluid and fetal movement were reviewed in detail with the patient. Please refer to After Visit Summary for other counseling recommendations.   Return in about 5 weeks (around 10/23/2019) for POSTPARTUM.  Future Appointments  Date Time Provider Department Center  09/20/2019  9:30 AM [facility]ENING [facility] None  09/22/2019 12:00 AM [facility] SCHED ROOM [facility] None    Sharyon Cable, CNM

## 2019-09-18 NOTE — Patient Instructions (Addendum)
GO TO MAU ON Saturday 2/24 @ 2PM FOR FOLEY BALLOON PLACEMENT   Balloon Catheter Placement for Cervical Ripening Balloon catheter placement for cervical ripening is a procedure to help your cervix start to soften (ripen) and open (dilate). It is done to prepare your body for labor induction. During this procedure, a thin tube (catheter) is placed through your cervix. A tiny balloon attached to the catheter is inflated with water. Pressure from the balloon is what helps your cervix start to open. Cervical ripening with a balloon catheter can make labor induction shorter and easier. You may have this procedure if:  Your cervix is not ready for labor.  Your health care provider has planned labor induction.  You are not having twins or multiples.  Your baby is in the head-down position.  You do not have any other pregnancy complications that require you to be monitored in the hospital after balloon catheter placement. If your health care provider has recommended labor induction to stimulate a vaginal birth, this procedure may be started the day before induction. You will go home with the balloon in place and return to start induction in 12-24 hours. You may have this procedure and stay in the hospital so that your progress can be monitored as well. Tell a health care provider about:  Any allergies you have.  All medicines you are taking, including vitamins, herbs, eye drops, creams, and over-the-counter medicines.  Any blood disorders you have.  Any surgeries you have had.  Any medical conditions you have. What are the risks? Generally, this is a safe procedure. However, problems may occur, including:  Infection.  Bleeding.  Cramping or pain.  Difficulty passing urine.  The baby moving from the head-down position to a position with the feet or buttocks down (breech position). What happens before the procedure?  Your health care provider may check your baby's heartbeat (fetal  monitoring) before the procedure.  You may be asked to empty your bladder. What happens during the procedure?   You will be positioned on the exam table as if you were having a pelvic exam or Pap test.  Your health care provider may insert a medical instrument into your vagina (speculum) to see your cervix.  Your cervix may be cleaned with a germ-killing solution.  The catheter will be inserted through the opening of your cervix.  A balloon on the end of the catheter will be inflated with sterile water. Some catheters have two balloons, one on each side of the cervix.  Depending on the type of balloon catheter, the end of the catheter may be left free outside your cervix or taped to your leg. The procedure may vary among health care providers and hospitals. What can I expect after the procedure?  After the procedure, it is common to have: ? A feeling of pressure inside your vagina. ? Light vaginal bleeding (spotting).  You may have fetal monitoring before going home.  You may be sent home with the catheter in place and asked to return to start your induction in about 12-24 hours. Follow these instructions at home:  Take over-the-counter and prescription medicines only as told by your health care provider.  Return to your normal activities at home as told by your health care provider. Ask your health care provider what activities are safe for you. Do not leave home until you return for labor induction.  You may shower at home. Do not take baths, swim, or use a hot tub unless your  health care provider approves.  As your cervix opens, your catheter and balloon may fall out before you return for labor induction. Ask your health care provider what you should do if this happens.  Keep all follow-up visits as told by your health care provider. This is important. You will need to return to start induction as told by your health care provider. Contact your health care provider if:  You  have chills or a fever.  You have constant pain or cramps (not contractions).  You have trouble passing urine.  Your water breaks.  You have vaginal bleeding that is heavier than spotting.  You have contractions that start to last longer and come closer together (about every 5 minutes).  The balloon catheter falls out before you return to start your induction. Summary  Cervical ripening with placement of a balloon catheter is an outpatient procedure to prepare you for labor induction.  Cervical ripening with a balloon catheter helps your cervix start to open for birth.  You will be positioned on the exam table. The catheter will be inserted through the opening of your cervix. A balloon on the end of the catheter will be inflated with water.  Pressure from the balloon will cause ripening of your cervix. You will go home with the balloon in place and return to start induction in 12-24 hours.  Contact your health care provider if you have pain, fever, vaginal bleeding, or trouble passing urine. Also contact him or her if your water breaks, you start to go into labor, or your balloon catheter falls out before you return to start your induction. This information is not intended to replace advice given to you by your health care provider. Make sure you discuss any questions you have with your health care provider. Document Revised: 03/09/2018 Document Reviewed: 03/09/2018 Elsevier Patient Education  2020 [employer].   Labor Induction  Labor induction is when steps are taken to cause a pregnant woman to begin the labor process. Most women go into labor on their own between 37 weeks and 42 weeks of pregnancy. When this does not happen or when there is a medical need for labor to begin, steps may be taken to induce labor. Labor induction causes a pregnant woman's uterus to contract. It also causes the cervix to soften (ripen), open (dilate), and thin out (efface). Usually, labor is not  induced before 39 weeks of pregnancy unless there is a medical reason to do so. Your health care provider will determine if labor induction is needed. Before inducing labor, your health care provider will consider a number of factors, including:  Your medical condition and your baby's.  How many weeks along you are in your pregnancy.  How mature your baby's lungs are.  The condition of your cervix.  The position of your baby.  The size of your birth canal. What are some reasons for labor induction? Labor may be induced if:  Your health or your baby's health is at risk.  Your pregnancy is overdue by 1 week or more.  Your water breaks but labor does not start on its own.  There is a low amount of amniotic fluid around your baby. You may also choose (elect) to have labor induced at a certain time. Generally, elective labor induction is done no earlier than 39 weeks of pregnancy. What methods are used for labor induction? Methods used for labor induction include:  Prostaglandin medicine. This medicine starts contractions and causes the cervix to dilate  and ripen. It can be taken by mouth (orally) or by being inserted into the vagina (suppository).  Inserting a small, thin tube (catheter) with a balloon into the vagina and then expanding the balloon with water to dilate the cervix.  Stripping the membranes. In this method, your health care provider gently separates amniotic sac tissue from the cervix. This causes the cervix to stretch, which in turn causes the release of a hormone called progesterone. The hormone causes the uterus to contract. This procedure is often done during an office visit, after which you will be sent home to wait for contractions to begin.  Breaking the water. In this method, your health care provider uses a small instrument to make a small hole in the amniotic sac. This eventually causes the amniotic sac to break. Contractions should begin after a few  hours.  Medicine to trigger or strengthen contractions. This medicine is given through an IV that is inserted into a vein in your arm. Except for membrane stripping, which can be done in a clinic, labor induction is done in the hospital so that you and your baby can be carefully monitored. How long does it take for labor to be induced? The length of time it takes to induce labor depends on how ready your body is for labor. Some inductions can take up to 2-3 days, while others may take less than a day. Induction may take longer if:  You are induced early in your pregnancy.  It is your first pregnancy.  Your cervix is not ready. What are some risks associated with labor induction? Some risks associated with labor induction include:  Changes in fetal heart rate, such as being too high, too low, or irregular (erratic).  Failed induction.  Infection in the mother or the baby.  Increased risk of having a cesarean delivery.  Fetal death.  Breaking off (abruption) of the placenta from the uterus (rare).  Rupture of the uterus (very rare). When induction is needed for medical reasons, the benefits of induction generally outweigh the risks. What are some reasons for not inducing labor? Labor induction should not be done if:  Your baby does not tolerate contractions.  You have had previous surgeries on your uterus, such as a myomectomy, removal of fibroids, or a vertical scar from a previous cesarean delivery.  Your placenta lies very low in your uterus and blocks the opening of the cervix (placenta previa).  Your baby is not in a head-down position.  The umbilical cord drops down into the birth canal in front of the baby.  There are unusual circumstances, such as the baby being very early (premature).  You have had more than 2 previous cesarean deliveries. Summary  Labor induction is when steps are taken to cause a pregnant woman to begin the labor process.  Labor induction  causes a pregnant woman's uterus to contract. It also causes the cervix to ripen, dilate, and efface.  Labor is not induced before 39 weeks of pregnancy unless there is a medical reason to do so.  When induction is needed for medical reasons, the benefits of induction generally outweigh the risks. This information is not intended to replace advice given to you by your health care provider. Make sure you discuss any questions you have with your health care provider. Document Revised: 07/14/2017 Document Reviewed: 08/24/2016 Elsevier Patient Education  2020 [employer].

## 2019-09-19 ENCOUNTER — Encounter: Payer: Self-pay | Admitting: Certified Nurse Midwife

## 2019-09-20 ENCOUNTER — Inpatient Hospital Stay (HOSPITAL_COMMUNITY): Admission: RE | Admit: 2019-09-20 | Payer: Medicaid Other | Source: Ambulatory Visit

## 2019-09-21 ENCOUNTER — Inpatient Hospital Stay (HOSPITAL_COMMUNITY)
Admission: AD | Admit: 2019-09-21 | Discharge: 2019-09-21 | Disposition: A | Discharge status: Home / Self Care | Payer: Medicaid Other | Source: Home / Self Care | Attending: Obstetrics & Gynecology | Admitting: Obstetrics & Gynecology

## 2019-09-21 ENCOUNTER — Encounter (HOSPITAL_COMMUNITY): Payer: Self-pay | Admitting: Obstetrics & Gynecology

## 2019-09-21 ENCOUNTER — Other Ambulatory Visit: Payer: Self-pay

## 2019-09-21 DIAGNOSIS — O480 Post-term pregnancy: Secondary | ICD-10-CM | POA: Insufficient documentation

## 2019-09-21 DIAGNOSIS — Z3A40 40 weeks gestation of pregnancy: Secondary | ICD-10-CM

## 2019-09-21 DIAGNOSIS — Z3689 Encounter for other specified antenatal screening: Secondary | ICD-10-CM

## 2019-09-21 DIAGNOSIS — Z87891 Personal history of nicotine dependence: Secondary | ICD-10-CM | POA: Insufficient documentation

## 2019-09-21 NOTE — MAU Provider Note (Signed)
HPI Raven Medina is a 19 y.o. female  G1P0 @40 .6 wks here for outpatient foley placement for cervical ripening. Pt reports no contractions. She denies VB or LOF. She reports normal fetal movement. All other systems negative.    Patient's last menstrual period was 12/09/2018 (exact date).  OB History  Gravida Para Term Preterm AB Living  1            SAB TAB Ectopic Multiple Live Births               # Outcome Date GA Lbr Len/2nd Weight Sex Delivery Anes PTL Lv  1 Current             Past Medical History:  Diagnosis Date  . ADHD (attention deficit hyperactivity disorder)   . Allergy    seasonal  . Asthma    intermittent  . Iron deficiency anemia 04/17/2014  . Obesity     Family History  Problem Relation Age of Onset  . Hypertension Mother   . Hypertension Maternal Grandmother   . Hypertension Maternal Grandfather     Social History   Socioeconomic History  . Marital status: Significant Other    Spouse name: Not on file  . Number of children: Not on file  . Years of education: 58  . Highest education level: High school graduate  Occupational History  . Not on file  Tobacco Use  . Smoking status: Former Smoker    Types: Cigars  . Smokeless tobacco: Never Used  . Tobacco comment: mother smokes inside home  Substance and Sexual Activity  . Alcohol use: Not Currently    Alcohol/week: 0.0 standard drinks    Comment: Social when not pregnant  . Drug use: Not Currently    Types: Marijuana    Comment: Last smoked 01/08/19  . Sexual activity: Yes    Birth control/protection: None  Other Topics Concern  . Not on file  Social History Narrative   Lives with Elenor Legato and Barbaraann Rondo, who are legal guardians.  Aunt smokes inside the home and in the car.   Tobacco? no   Drugs/ETOH? no   Partner preference? female Sexually Active? In past - not currently   Pregnancy Prevention: condoms and implant, reviewed condoms & plan B   Safe at home, in school & in relationships? Yes    Safe to self? Yes      Social Determinants of Health   Financial Resource Strain: Low Risk   . Difficulty of Paying Living Expenses: Not very hard  Food Insecurity: No Food Insecurity  . Worried About Charity fundraiser in the Last Year: Never true  . Ran Out of Food in the Last Year: Never true  Transportation Needs: No Transportation Needs  . Lack of Transportation (Medical): No  . Lack of Transportation (Non-Medical): No  Physical Activity: Inactive  . Days of Exercise per Week: 0 days  . Minutes of Exercise per Session: 0 min  Stress: No Stress Concern Present  . Feeling of Stress : Not at all  Social Connections: Moderately Isolated  . Frequency of Communication with Friends and Family: Twice a week  . Frequency of Social Gatherings with Friends and Family: Twice a week  . Attends Religious Services: Never  . Active Member of Clubs or Organizations: No  . Attends Archivist Meetings: Never  . Marital Status: Never married    No Known Allergies  No current facility-administered medications on file prior to encounter.   Current Outpatient  Medications on File Prior to Encounter  Medication Sig Dispense Refill  . Prenatal Vit-Fe Fumarate-FA (PRENATAL VITAMIN) 27-0.8 MG TABS Take 1 tablet by mouth daily. 30 tablet 9    Review of Systems  Physical Exam   Vitals:   09/21/19 1423 09/21/19 1427  BP:  122/78  Pulse:  94  Resp:  18  Temp:  97.8 F (36.6 C)  TempSrc:  Oral  SpO2:  100%  Weight: 104.3 kg     Physical Exam     MAU Course   Procedure: Patient informed of R/B/A of procedure. NST was performed and was reactive prior to procedure. Procedure done to begin ripening of the cervix prior to admission for induction of labor. Appropriate time out taken. The patient was placed in the lithotomy position and a cervical exam was performed and a finger was used to guide the 69F foley balloon through the internal os of the cervix. Foley Balloon  filled with 60cc of sterile water. Plug inserted into end of the foley. Foley placed on tension and taped to medical thigh. NST:  Baseline: 140 bpm, Variability: Good {> 6 bpm), Accelerations: Reactive and Decelerations: Absent there were no signs of tachysystole or hypertonus. All equipment was removed and accounted for. The patient tolerated the procedure well.   Assessment and Plan:   1. Post-term pregnancy, 40-42 weeks of gestation   2. [redacted] weeks gestation of pregnancy   3. NST (non-stress test) reactive     S/p Outpatient placement of foley balloon catheter for cervical ripening. Induction of labor scheduled for tomorrow at 1200 am. Reassuring FHR tracing with no concerns at present. Warning signs given to patient to include return to MAU for heavy vaginal bleeding, rupture of membranes, painful uterine contractions q 5 mins or less, severe abdominal discomfort, decreased fetal movement.   Rolm Bookbinder, [place] 09/21/2019 2:47 PM

## 2019-09-21 NOTE — Discharge Instructions (Signed)
Balloon Catheter Placement for Cervical Ripening Balloon catheter placement for cervical ripening is a procedure to help your cervix start to soften (ripen) and open (dilate). It is done to prepare your body for labor induction. During this procedure, a thin tube (catheter) is placed through your cervix. A tiny balloon attached to the catheter is inflated with water. Pressure from the balloon is what helps your cervix start to open. Cervical ripening with a balloon catheter can make labor induction shorter and easier. You may have this procedure if:  Your cervix is not ready for labor.  Your health care provider has planned labor induction.  You are not having twins or multiples.  Your baby is in the head-down position.  You do not have any other pregnancy complications that require you to be monitored in the hospital after balloon catheter placement. If your health care provider has recommended labor induction to stimulate a vaginal birth, this procedure may be started the day before induction. You will go home with the balloon in place and return to start induction in 12-24 hours. You may have this procedure and stay in the hospital so that your progress can be monitored as well. Tell a health care provider about:  Any allergies you have.  All medicines you are taking, including vitamins, herbs, eye drops, creams, and over-the-counter medicines.  Any blood disorders you have.  Any surgeries you have had.  Any medical conditions you have. What are the risks? Generally, this is a safe procedure. However, problems may occur, including:  Infection.  Bleeding.  Cramping or pain.  Difficulty passing urine.  The baby moving from the head-down position to a position with the feet or buttocks down (breech position). What happens before the procedure?  Your health care provider may check your baby's heartbeat (fetal monitoring) before the procedure.  You may be asked to empty your  bladder. What happens during the procedure?   You will be positioned on the exam table as if you were having a pelvic exam or Pap test.  Your health care provider may insert a medical instrument into your vagina (speculum) to see your cervix.  Your cervix may be cleaned with a germ-killing solution.  The catheter will be inserted through the opening of your cervix.  A balloon on the end of the catheter will be inflated with sterile water. Some catheters have two balloons, one on each side of the cervix.  Depending on the type of balloon catheter, the end of the catheter may be left free outside your cervix or taped to your leg. The procedure may vary among health care providers and hospitals. What can I expect after the procedure?  After the procedure, it is common to have: ? A feeling of pressure inside your vagina. ? Light vaginal bleeding (spotting).  You may have fetal monitoring before going home.  You may be sent home with the catheter in place and asked to return to start your induction in about 12-24 hours. Follow these instructions at home:  Take over-the-counter and prescription medicines only as told by your health care provider.  Return to your normal activities at home as told by your health care provider. Ask your health care provider what activities are safe for you. Do not leave home until you return for labor induction.  You may shower at home. Do not take baths, swim, or use a hot tub unless your health care provider approves.  As your cervix opens, your catheter and balloon may   fall out before you return for labor induction. Ask your health care provider what you should do if this happens.  Keep all follow-up visits as told by your health care provider. This is important. You will need to return to start induction as told by your health care provider. Contact your health care provider if:  You have chills or a fever.  You have constant pain or cramps (not  contractions).  You have trouble passing urine.  Your water breaks.  You have vaginal bleeding that is heavier than spotting.  You have contractions that start to last longer and come closer together (about every 5 minutes).  The balloon catheter falls out before you return to start your induction. Summary  Cervical ripening with placement of a balloon catheter is an outpatient procedure to prepare you for labor induction.  Cervical ripening with a balloon catheter helps your cervix start to open for birth.  You will be positioned on the exam table. The catheter will be inserted through the opening of your cervix. A balloon on the end of the catheter will be inflated with water.  Pressure from the balloon will cause ripening of your cervix. You will go home with the balloon in place and return to start induction in 12-24 hours.  Contact your health care provider if you have pain, fever, vaginal bleeding, or trouble passing urine. Also contact him or her if your water breaks, you start to go into labor, or your balloon catheter falls out before you return to start your induction. This information is not intended to replace advice given to you by your health care provider. Make sure you discuss any questions you have with your health care provider. Document Revised: 03/09/2018 Document Reviewed: 03/09/2018 Elsevier Patient Education  2020 [employer] Inc[employer]  

## 2019-09-21 NOTE — MAU Note (Signed)
Patient presents to MAU for foley balloon placement for induction.  Feeling some non painful CTX.  No LOF or vaginal bleeding.  Endorses good fetal movement.

## 2019-09-22 ENCOUNTER — Inpatient Hospital Stay (HOSPITAL_COMMUNITY): Payer: Medicaid Other | Admitting: Anesthesiology

## 2019-09-22 ENCOUNTER — Inpatient Hospital Stay (HOSPITAL_COMMUNITY): Payer: Medicaid Other

## 2019-09-22 ENCOUNTER — Encounter (HOSPITAL_COMMUNITY): Payer: Self-pay | Admitting: Family Medicine

## 2019-09-22 ENCOUNTER — Inpatient Hospital Stay (HOSPITAL_COMMUNITY)
Admission: AD | Admit: 2019-09-22 | Discharge: 2019-09-24 | DRG: 807 | Disposition: A | Discharge status: Home / Self Care | Payer: Medicaid Other | Attending: Family Medicine | Admitting: Family Medicine

## 2019-09-22 DIAGNOSIS — O139 Gestational [pregnancy-induced] hypertension without significant proteinuria, unspecified trimester: Secondary | ICD-10-CM

## 2019-09-22 DIAGNOSIS — F4322 Adjustment disorder with anxiety: Secondary | ICD-10-CM | POA: Diagnosis present

## 2019-09-22 DIAGNOSIS — B951 Streptococcus, group B, as the cause of diseases classified elsewhere: Secondary | ICD-10-CM | POA: Diagnosis present

## 2019-09-22 DIAGNOSIS — O9902 Anemia complicating childbirth: Secondary | ICD-10-CM | POA: Diagnosis present

## 2019-09-22 DIAGNOSIS — Z87891 Personal history of nicotine dependence: Secondary | ICD-10-CM

## 2019-09-22 DIAGNOSIS — J45909 Unspecified asthma, uncomplicated: Secondary | ICD-10-CM | POA: Diagnosis present

## 2019-09-22 DIAGNOSIS — O99344 Other mental disorders complicating childbirth: Secondary | ICD-10-CM | POA: Diagnosis present

## 2019-09-22 DIAGNOSIS — O9952 Diseases of the respiratory system complicating childbirth: Secondary | ICD-10-CM | POA: Diagnosis not present

## 2019-09-22 DIAGNOSIS — Z20822 Contact with and (suspected) exposure to covid-19: Secondary | ICD-10-CM | POA: Diagnosis present

## 2019-09-22 DIAGNOSIS — Z3A41 41 weeks gestation of pregnancy: Secondary | ICD-10-CM

## 2019-09-22 DIAGNOSIS — O99824 Streptococcus B carrier state complicating childbirth: Secondary | ICD-10-CM | POA: Diagnosis present

## 2019-09-22 DIAGNOSIS — S3991XA Unspecified injury of abdomen, initial encounter: Secondary | ICD-10-CM

## 2019-09-22 DIAGNOSIS — Z3400 Encounter for supervision of normal first pregnancy, unspecified trimester: Secondary | ICD-10-CM

## 2019-09-22 DIAGNOSIS — O99214 Obesity complicating childbirth: Secondary | ICD-10-CM | POA: Diagnosis present

## 2019-09-22 DIAGNOSIS — Z3689 Encounter for other specified antenatal screening: Secondary | ICD-10-CM

## 2019-09-22 DIAGNOSIS — O480 Post-term pregnancy: Secondary | ICD-10-CM | POA: Diagnosis not present

## 2019-09-22 DIAGNOSIS — O9A213 Injury, poisoning and certain other consequences of external causes complicating pregnancy, third trimester: Secondary | ICD-10-CM

## 2019-09-22 DIAGNOSIS — O134 Gestational [pregnancy-induced] hypertension without significant proteinuria, complicating childbirth: Secondary | ICD-10-CM | POA: Diagnosis present

## 2019-09-22 DIAGNOSIS — D649 Anemia, unspecified: Secondary | ICD-10-CM | POA: Diagnosis present

## 2019-09-22 DIAGNOSIS — O99511 Diseases of the respiratory system complicating pregnancy, first trimester: Secondary | ICD-10-CM | POA: Diagnosis present

## 2019-09-22 LAB — CBC
HCT: 32.4 % — ABNORMAL LOW (ref 36.0–46.0)
Hemoglobin: 9.9 g/dL — ABNORMAL LOW (ref 12.0–15.0)
MCH: 24.4 pg — ABNORMAL LOW (ref 26.0–34.0)
MCHC: 30.6 g/dL (ref 30.0–36.0)
MCV: 79.8 fL — ABNORMAL LOW (ref 80.0–100.0)
Platelets: 188 10*3/uL (ref 150–400)
RBC: 4.06 MIL/uL (ref 3.87–5.11)
RDW: 17.3 % — ABNORMAL HIGH (ref 11.5–15.5)
WBC: 8.3 10*3/uL (ref 4.0–10.5)
nRBC: 0 % (ref 0.0–0.2)

## 2019-09-22 LAB — COMPREHENSIVE METABOLIC PANEL
ALT: 9 U/L (ref 0–44)
AST: 20 U/L (ref 15–41)
Albumin: 2.8 g/dL — ABNORMAL LOW (ref 3.5–5.0)
Alkaline Phosphatase: 136 U/L — ABNORMAL HIGH (ref 38–126)
Anion gap: 10 (ref 5–15)
BUN: 9 mg/dL (ref 6–20)
CO2: 19 mmol/L — ABNORMAL LOW (ref 22–32)
Calcium: 9 mg/dL (ref 8.9–10.3)
Chloride: 109 mmol/L (ref 98–111)
Creatinine, Ser: 0.68 mg/dL (ref 0.44–1.00)
GFR calc Af Amer: >60 mL/min (ref >60)
GFR calc non Af Amer: >60 mL/min (ref >60)
Glucose, Bld: 86 mg/dL (ref 70–99)
Potassium: 3.5 mmol/L (ref 3.5–5.1)
Sodium: 138 mmol/L (ref 135–145)
Total Bilirubin: 0.5 mg/dL (ref 0.3–1.2)
Total Protein: 6.5 g/dL (ref 6.5–8.1)

## 2019-09-22 LAB — RPR: RPR Ser Ql: NONREACTIVE

## 2019-09-22 LAB — RESPIRATORY PANEL BY RT PCR (FLU A&B, COVID)
Influenza A by PCR: NEGATIVE
Influenza B by PCR: NEGATIVE
SARS Coronavirus 2 by RT PCR: NEGATIVE

## 2019-09-22 LAB — TYPE AND SCREEN
ABO/RH(D): O POS
Antibody Screen: NEGATIVE

## 2019-09-22 MED ORDER — DIBUCAINE (PERIANAL) 1 % EX OINT: 1.0000 "application " | TOPICAL_OINTMENT | CUTANEOUS | Status: DC | PRN

## 2019-09-22 MED ORDER — LACTATED RINGERS IV SOLN: INTRAVENOUS | Status: DC

## 2019-09-22 MED ORDER — PENICILLIN G POT IN DEXTROSE 60000 UNIT/ML IV SOLN
3.0000 10*6.[IU] | INTRAVENOUS | Status: DC
  Administered 2019-09-22: 3 10*6.[IU] via INTRAVENOUS
  Filled 2019-09-22: qty 50

## 2019-09-22 MED ORDER — LACTATED RINGERS IV SOLN: 500.0000 mL | INTRAVENOUS | Status: DC | PRN

## 2019-09-22 MED ORDER — PHENYLEPHRINE 40 MCG/ML (10ML) SYRINGE FOR IV PUSH (FOR BLOOD PRESSURE SUPPORT): 80.0000 ug | PREFILLED_SYRINGE | INTRAVENOUS | Status: DC | PRN

## 2019-09-22 MED ORDER — ONDANSETRON HCL 4 MG PO TABS: 4.0000 mg | ORAL_TABLET | ORAL | Status: DC | PRN

## 2019-09-22 MED ORDER — OXYCODONE-ACETAMINOPHEN 5-325 MG PO TABS: 2.0000 | ORAL_TABLET | ORAL | Status: DC | PRN

## 2019-09-22 MED ORDER — TETANUS-DIPHTH-ACELL PERTUSSIS 5-2.5-18.5 LF-MCG/0.5 IM SUSP: 0.5000 mL | Freq: Once | INTRAMUSCULAR | Status: DC

## 2019-09-22 MED ORDER — SENNOSIDES-DOCUSATE SODIUM 8.6-50 MG PO TABS
2.0000 | ORAL_TABLET | ORAL | Status: DC
  Administered 2019-09-23: 21:00:00 2 via ORAL
  Filled 2019-09-22 (×2): qty 2

## 2019-09-22 MED ORDER — BENZOCAINE-MENTHOL 20-0.5 % EX AERO
1.0000 "application " | INHALATION_SPRAY | CUTANEOUS | Status: DC | PRN
  Filled 2019-09-22: qty 56

## 2019-09-22 MED ORDER — ONDANSETRON HCL 4 MG/2ML IJ SOLN: 4.0000 mg | INTRAMUSCULAR | Status: DC | PRN

## 2019-09-22 MED ORDER — PRENATAL MULTIVITAMIN CH
1.0000 | ORAL_TABLET | Freq: Every day | ORAL | Status: DC
  Administered 2019-09-22 – 2019-09-24 (×3): 1 via ORAL
  Filled 2019-09-22 (×3): qty 1

## 2019-09-22 MED ORDER — EPHEDRINE 5 MG/ML INJ: 10.0000 mg | INTRAVENOUS | Status: DC | PRN

## 2019-09-22 MED ORDER — TERBUTALINE SULFATE 1 MG/ML IJ SOLN: 0.2500 mg | Freq: Once | INTRAMUSCULAR | Status: DC | PRN

## 2019-09-22 MED ORDER — MISOPROSTOL 50MCG HALF TABLET: 50.0000 ug | ORAL_TABLET | ORAL | Status: DC | PRN

## 2019-09-22 MED ORDER — OXYTOCIN 40 UNITS IN NORMAL SALINE INFUSION - SIMPLE MED
2.5000 [IU]/h | INTRAVENOUS | Status: DC
  Filled 2019-09-22: qty 1000

## 2019-09-22 MED ORDER — COCONUT OIL OIL: 1.0000 "application " | TOPICAL_OIL | Status: DC | PRN

## 2019-09-22 MED ORDER — SOD CITRATE-CITRIC ACID 500-334 MG/5ML PO SOLN: 30.0000 mL | ORAL | Status: DC | PRN

## 2019-09-22 MED ORDER — FENTANYL-BUPIVACAINE-NACL 0.5-0.125-0.9 MG/250ML-% EP SOLN: 12.0000 mL/h | EPIDURAL | Status: DC | PRN

## 2019-09-22 MED ORDER — SODIUM CHLORIDE (PF) 0.9 % IJ SOLN
INTRAMUSCULAR | Status: DC | PRN
  Administered 2019-09-22: 12 mL/h via EPIDURAL

## 2019-09-22 MED ORDER — LIDOCAINE HCL (PF) 1 % IJ SOLN: 30.0000 mL | INTRAMUSCULAR | Status: DC | PRN

## 2019-09-22 MED ORDER — SODIUM CHLORIDE 0.9 % IV SOLN
5.0000 10*6.[IU] | Freq: Once | INTRAVENOUS | Status: AC
  Administered 2019-09-22: 5 10*6.[IU] via INTRAVENOUS
  Filled 2019-09-22: qty 5

## 2019-09-22 MED ORDER — LACTATED RINGERS IV SOLN: 500.0000 mL | Freq: Once | INTRAVENOUS | Status: DC

## 2019-09-22 MED ORDER — OXYCODONE-ACETAMINOPHEN 5-325 MG PO TABS: 1.0000 | ORAL_TABLET | ORAL | Status: DC | PRN

## 2019-09-22 MED ORDER — ONDANSETRON HCL 4 MG/2ML IJ SOLN: 4.0000 mg | Freq: Four times a day (QID) | INTRAMUSCULAR | Status: DC | PRN

## 2019-09-22 MED ORDER — FENTANYL CITRATE (PF) 100 MCG/2ML IJ SOLN
100.0000 ug | INTRAMUSCULAR | Status: DC | PRN
  Administered 2019-09-22: 100 ug via INTRAVENOUS

## 2019-09-22 MED ORDER — OXYTOCIN BOLUS FROM INFUSION
500.0000 mL | Freq: Once | INTRAVENOUS | Status: AC
  Administered 2019-09-22: 500 mL via INTRAVENOUS

## 2019-09-22 MED ORDER — DIPHENHYDRAMINE HCL 50 MG/ML IJ SOLN: 12.5000 mg | INTRAMUSCULAR | Status: DC | PRN

## 2019-09-22 MED ORDER — SIMETHICONE 80 MG PO CHEW: 80.0000 mg | CHEWABLE_TABLET | ORAL | Status: DC | PRN

## 2019-09-22 MED ORDER — MISOPROSTOL 25 MCG QUARTER TABLET: 25.0000 ug | ORAL_TABLET | ORAL | Status: DC | PRN

## 2019-09-22 MED ORDER — LIDOCAINE HCL (PF) 1 % IJ SOLN
INTRAMUSCULAR | Status: DC | PRN
  Administered 2019-09-22 (×2): 5 mL via EPIDURAL

## 2019-09-22 MED ORDER — MEASLES, MUMPS & RUBELLA VAC IJ SOLR: 0.5000 mL | Freq: Once | INTRAMUSCULAR | Status: DC

## 2019-09-22 MED ORDER — DIPHENHYDRAMINE HCL 25 MG PO CAPS: 25.0000 mg | ORAL_CAPSULE | Freq: Four times a day (QID) | ORAL | Status: DC | PRN

## 2019-09-22 MED ORDER — IBUPROFEN 600 MG PO TABS
600.0000 mg | ORAL_TABLET | Freq: Three times a day (TID) | ORAL | Status: DC | PRN
  Administered 2019-09-22 – 2019-09-24 (×6): 600 mg via ORAL
  Filled 2019-09-22 (×7): qty 1

## 2019-09-22 MED ORDER — WITCH HAZEL-GLYCERIN EX PADS: 1.0000 "application " | MEDICATED_PAD | CUTANEOUS | Status: DC | PRN

## 2019-09-22 MED ORDER — ACETAMINOPHEN 325 MG PO TABS: 650.0000 mg | ORAL_TABLET | ORAL | Status: DC | PRN

## 2019-09-22 MED ORDER — FENTANYL-BUPIVACAINE-NACL 0.5-0.125-0.9 MG/250ML-% EP SOLN
EPIDURAL | Status: AC
  Filled 2019-09-22: qty 250

## 2019-09-22 MED ORDER — OXYTOCIN 40 UNITS IN NORMAL SALINE INFUSION - SIMPLE MED
1.0000 m[IU]/min | INTRAVENOUS | Status: DC
  Administered 2019-09-22: 02:00:00 2 m[IU]/min via INTRAVENOUS

## 2019-09-22 MED ORDER — ACETAMINOPHEN 325 MG PO TABS
650.0000 mg | ORAL_TABLET | Freq: Four times a day (QID) | ORAL | Status: DC | PRN
  Administered 2019-09-23 – 2019-09-24 (×3): 650 mg via ORAL
  Filled 2019-09-22 (×3): qty 2

## 2019-09-22 NOTE — Progress Notes (Signed)
Labor Progress Note Raven Medina is a 19 y.o. G1P0 at [redacted]w[redacted]d presented for PD IOL.  S: Desires epidural.   O:  BP 139/79   Pulse 91   Temp 98.7 F (37.1 C) (Oral)   Resp 16   Ht 5\' 5"  (1.651 m)   Wt 105 kg   LMP 12/09/2018 (Exact Date)   SpO2 100%   BMI 38.52 kg/m  EFM: 145, moderate variability, pos accels, no decels, reactive TOCO: q104m  CVE: Dilation: 8 Effacement (%): 90 Station: 0 Presentation: Vertex Exam by:: J Mbugua RN   A&P: 19 y.o. G1P0 [redacted]w[redacted]d here for PD IOL. #Labor: Progressing well. S/p FB, Pit started at 0152. SROM occurred prior to arrival. Anticipate SVD. #Pain: per patient request #FWB: Cat I #GBS positive; PCN  [redacted]w[redacted]d, MD 4:44 AM

## 2019-09-22 NOTE — Lactation Note (Signed)
This note was copied from a baby's chart. Lactation Consultation Note  Patient Name: Girl Allison Deshotels Today's Date: 09/22/2019 Reason for consult: Initial assessment  RN Corrie Dandy reported to [facility] that mom is not going to be putting baby to breast, she has decided to pump and bottle feed exclusively before quitting BF. Mom told RN Corrie Dandy she will try pumping and bottle for 24 hours. If that doesn't work for her, then she'll switch to formula only. LC services to follow up tomorrow regarding mom's pumping/BF status. Baby is currently on Gerber Gentle in the meantime.   Maternal Data Has patient been taught Hand Expression?: Yes Does the patient have breastfeeding experience prior to this delivery?: Yes  Feeding Feeding Type: Breast Milk  LATCH Score Latch: Grasps breast easily, tongue down, lips flanged, rhythmical sucking.  Audible Swallowing: A few with stimulation  Type of Nipple: Everted at rest and after stimulation  Comfort (Breast/Nipple): Soft / non-tender  Hold (Positioning): Assistance needed to correctly position infant at breast and maintain latch.  LATCH Score: 8  Interventions Interventions: DEBP  Lactation Tools Discussed/Used Tools: Pump Breast pump type: Double-Electric Breast Pump WIC Program: Yes Pump Review: Setup, frequency, and cleaning;Milk Storage Initiated by:: Claudina Lick Date initiated:: 09/22/19   Consult Status Consult Status: Follow-up Date: 09/23/19 Follow-up type: In-patient    Altheia Shafran Venetia Constable 09/22/2019, 5:42 PM

## 2019-09-22 NOTE — H&P (Signed)
OBSTETRIC ADMISSION HISTORY AND PHYSICAL  Raven Medina is a 19 y.o. female G1P0 with IUP at [redacted]w[redacted]d by LMP presenting for PD IOL. Reports LOF since around 2320 on the ride to the hospital. She reports +FMs, no VB, no blurry vision, headaches or peripheral edema, and RUQ pain.  She plans on breast and bottle feeding. She requests condoms for birth control. She received her prenatal care at [facility].   Dating: By LMP --->  Estimated Date of Delivery: 09/15/19  Sono: 10/26    @[redacted]w[redacted]d , CWD, normal anatomy, cephalic presentation, anterior lie, 662g, 75% EFW  Prenatal History/Complications: Asthma Anxiety GBS positive  Positive for GC 02/27/2019  Past Medical History: Past Medical History:  Diagnosis Date  . ADHD (attention deficit hyperactivity disorder)   . Allergy    seasonal  . Asthma    intermittent  . Iron deficiency anemia 04/17/2014  . Obesity     Past Surgical History: Past Surgical History:  Procedure Laterality Date  . HERNIA REPAIR    . myringotomy with tubes Bilateral    at age 49  . WISDOM TOOTH EXTRACTION      Obstetrical History: OB History    Gravida  1   Para      Term      Preterm      AB      Living        SAB      TAB      Ectopic      Multiple      Live Births              Social History: Social History   Socioeconomic History  . Marital status: Significant Other    Spouse name: Not on file  . Number of children: Not on file  . Years of education: 56  . Highest education level: High school graduate  Occupational History  . Not on file  Tobacco Use  . Smoking status: Former Smoker    Types: Cigars  . Smokeless tobacco: Never Used  . Tobacco comment: mother smokes inside home  Substance and Sexual Activity  . Alcohol use: Not Currently    Alcohol/week: 0.0 standard drinks    Comment: Social when not pregnant  . Drug use: Not Currently    Types: Marijuana    Comment: Last smoked 01/08/19  . Sexual activity: Yes    Birth  control/protection: None  Other Topics Concern  . Not on file  Social History Narrative   Lives with 01/10/19 and Celine Ahr, who are legal guardians.  Aunt smokes inside the home and in the car.   Tobacco? no   Drugs/ETOH? no   Partner preference? female Sexually Active? In past - not currently   Pregnancy Prevention: condoms and implant, reviewed condoms & plan B   Safe at home, in school & in relationships? Yes   Safe to self? Yes      Social Determinants of Health   Financial Resource Strain: Low Risk   . Difficulty of Paying Living Expenses: Not very hard  Food Insecurity: No Food Insecurity  . Worried About Kateri Mc in the Last Year: Never true  . Ran Out of Food in the Last Year: Never true  Transportation Needs: No Transportation Needs  . Lack of Transportation (Medical): No  . Lack of Transportation (Non-Medical): No  Physical Activity: Inactive  . Days of Exercise per Week: 0 days  . Minutes of Exercise per Session: 0 min  Stress:  No Stress Concern Present  . Feeling of Stress : Not at all  Social Connections: Moderately Isolated  . Frequency of Communication with Friends and Family: Twice a week  . Frequency of Social Gatherings with Friends and Family: Twice a week  . Attends Religious Services: Never  . Active Member of Clubs or Organizations: No  . Attends Archivist Meetings: Never  . Marital Status: Never married    Family History: Family History  Problem Relation Age of Onset  . Hypertension Mother   . Hypertension Maternal Grandmother   . Hypertension Maternal Grandfather     Allergies: No Known Allergies  Medications Prior to Admission  Medication Sig Dispense Refill Last Dose  . Prenatal Vit-Fe Fumarate-FA (PRENATAL VITAMIN) 27-0.8 MG TABS Take 1 tablet by mouth daily. 30 tablet 9      Review of Systems   All systems reviewed and negative except as stated in HPI  Blood pressure (!) 142/87, pulse 85, temperature 98.7 F  (37.1 C), temperature source Oral, resp. rate 16, last menstrual period 12/09/2018. General appearance: alert, cooperative and appears stated age Lungs: normal effort Heart: regular rate  Abdomen: soft, non-tender; bowel sounds normal Pelvic: gravid uterus GU: No vaginal lesions  Extremities: Homans sign is negative, no sign of DVT Presentation: cephalic by exam Fetal monitoring: 150, moderate variability, no accels currently, deep variable decels, reactive Uterine activity: None Dilation: 5 Effacement (%): 60 Station: -3 Exam by:: Dr. Montez Morita   Prenatal labs: ABO, Rh: O/Positive/-- (08/05 1541) Antibody: Negative (08/05 1541) Rubella: 4.65 (08/05 1541) RPR: Non Reactive (12/03 0839)  HBsAg: Negative (08/05 1541)  HIV: Non Reactive (12/03 0839)  GBS: Positive/-- (01/27 1334)  2 hr Glucola WNL Genetic screening  Low risk Anatomy US WNL  Prenatal Transfer Tool  Maternal Diabetes: No Genetic Screening: Normal Maternal Ultrasounds/Referrals: Normal Fetal Ultrasounds or other Referrals:  None Maternal Substance Abuse:  No Significant Maternal Medications:  None Significant Maternal Lab Results: Group B Strep positive  No results found for this or any previous visit (from the past 24 hour(s)).  Patient Active Problem List   Diagnosis Date Noted  . [redacted] weeks gestation of pregnancy 09/22/2019  . Post-dates pregnancy 09/22/2019  . Positive GBS test 09/11/2019  . Traumatic injury during pregnancy in third trimester 07/21/2019  . NST (non-stress test) reactive on fetal surveillance 07/21/2019  . Blunt abdominal trauma 05/15/2019  . Asthma affecting pregnancy in first trimester 02/27/2019  . Anemia in pregnancy, antepartum 02/27/2019  . Supervision of normal first pregnancy, antepartum 02/19/2019  . Acne vulgaris 01/11/2019  . Adjustment disorder with anxiety 07/10/2015  . ADHD (attention deficit hyperactivity disorder) 12/05/2012    Assessment/Plan:  [name] is a  19 y.o. G1P0 at [redacted]w[redacted]d here for PD IOL.   #Labor: Vertex by exam. Foley bulb placed yesterday afternoon and able to be pulled out on admission tonight. Upon admission, deep variables to 40's noted. Patient reported leaking of fluid since 2320 and found to be ruptured on exam. FSE due to difficulty monitoring due to decels. Will start Pitocin. Anticipate SVD. #Pain: Per patient request #FWB: Cat II, reassuring for moderate variability; EFW: 3700g #ID:  GBS pos; PCN #MOF: Both  #MOC: Declines #Circ:  NA; female   Barrington Ellison, MD [facility] Fellow, [facility] 09/22/2019, 12:33 AM

## 2019-09-22 NOTE — Anesthesia Procedure Notes (Signed)
Epidural Patient location during procedure: OB Start time: 09/22/2019 3:15 AM End time: 09/22/2019 3:23 AM  Staffing Anesthesiologist: Achille Rich, MD Performed: anesthesiologist   Preanesthetic Checklist Completed: patient identified, IV checked, site marked, risks and benefits discussed, monitors and equipment checked, pre-op evaluation and timeout performed  Epidural Patient position: sitting Prep: DuraPrep Patient monitoring: heart rate, cardiac monitor, continuous pulse ox and blood pressure Approach: midline Location: L2-L3 Injection technique: LOR saline  Needle:  Needle type: Tuohy  Needle gauge: 17 G Needle length: 9 cm Needle insertion depth: 7 cm Catheter type: closed end flexible Catheter size: 19 Gauge Catheter at skin depth: 12 cm Test dose: negative and Other  Assessment Events: blood not aspirated, injection not painful, no injection resistance and negative IV test  Additional Notes Informed consent obtained prior to proceeding including risk of failure, 1% risk of PDPH, risk of minor discomfort and bruising.  Discussed rare but serious complications including epidural abscess, permanent nerve injury, epidural hematoma.  Discussed alternatives to epidural analgesia and patient desires to proceed.  Timeout performed pre-procedure verifying patient name, procedure, and platelet count.  Patient tolerated procedure well. Reason for block:procedure for pain

## 2019-09-22 NOTE — Lactation Note (Addendum)
This note was copied from a baby's chart. Lactation Consultation Note:  Mother is a P50, infant is 32 hours old  Mother was given [facility] brochure and basic teaching done.  Mother reports that she is giving infant bottles because her milk is not in yet. LC offered assistance with hand expression and offered assistance with latching infant . Mother reports she would like to try to breastfeed.   Reviewed hand expression with mother. Observed large drops of colostrum. Mothers nipples are erect with compressible breast tissue.   She is active with WIC .  Mother assist with latching infant on in football hold. . Infant latched on and off . Observed infant suckling and a few swallows. Mother reports that " I dont like this position." Infant placed in cross cradle hold. Infant latched for a few mins and then mother reports , " I just don't like this breastfeeding". Mother reports that Maybe I will just pump and bottle feed.   Reported this to mother/baby nurse to sat up a DEBP if mother desires.   Mother to continue to cue base feed infant and feed at least 8-12 times or more in 24 hours and advised to allow for cluster feeding infant as needed.   Mother to continue to due STS. Mother is aware of available LC services at [facility], BFSG'S, OP Dept, and phone # for questions or concerns about breastfeeding.  Mother receptive to all teaching and plan of care.    Patient Name: Raven Medina IWLNL'G Date: 09/22/2019 Reason for consult: Initial assessment   Maternal Data Has patient been taught Hand Expression?: Yes Does the patient have breastfeeding experience prior to this delivery?: Yes  Feeding Feeding Type: Breast Fed Nipple Type: Slow - flow  LATCH Score Latch: Grasps breast easily, tongue down, lips flanged, rhythmical sucking.  Audible Swallowing: A few with stimulation  Type of Nipple: Everted at rest and after stimulation  Comfort (Breast/Nipple): Soft / non-tender  Hold  (Positioning): Assistance needed to correctly position infant at breast and maintain latch.  LATCH Score: 8  Interventions Interventions: Breast feeding basics reviewed;Assisted with latch;Skin to skin;Hand express;Adjust position;Support pillows;Position options  Lactation Tools Discussed/Used WIC Program: Yes   Consult Status Consult Status: Follow-up Date: 09/23/19 Follow-up type: In-patient    Stevan Born [facility] 09/22/2019, 3:31 PM

## 2019-09-22 NOTE — Discharge Summary (Signed)
  Postpartum Discharge Summary     Patient Name: Raven Medina DOB: 08/12/2000 MRN: [id number]  Date of admission: 09/22/2019 Delivering Provider: FAIR, CHELSEA N   Date of discharge: 09/24/2019  Admitting diagnosis: Post-dates pregnancy [O48.0] Intrauterine pregnancy: [redacted]w[redacted]d     Secondary diagnosis:  Active Problems:   Adjustment disorder with anxiety   Asthma affecting pregnancy in first trimester   Positive GBS test   [redacted] weeks gestation of pregnancy   Post-dates pregnancy   Gestational hypertension  Additional problems: None     Discharge diagnosis: Term Pregnancy Delivered                                                                                                Post partum procedures:None  Augmentation: Pitocin and Foley Balloon  Complications: None  Hospital course:  Induction of Labor With Vaginal Delivery   18 y.o. yo G1P0 at [redacted]w[redacted]d was admitted to the hospital 09/22/2019 for induction of labor.  Indication for induction: Postdates.  Patient had an uncomplicated labor course as follows: Initial SVE: 5/60/-3. Patient had received Foley bulb outpatient which was removed on admission. SROM occurred just prior to admission. Received Pitocin and epidural. She then progressed to complete.  Membrane Rupture Time/Date: 11:24 PM ,09/21/2019   Intrapartum Procedures: Episiotomy: None [1]                                         Lacerations:  None [1]  Patient had delivery of a Viable infant.  Information for the patient's newborn:  Battershell, Girl Maiyah [[id number]]  Delivery Method: Vag-Spont    09/22/2019  Details of delivery can be found in separate delivery note. BP's met criteria for gHTN post-partum. Patient started on Norvasc 5 mg and BP check requested. Declined birth control. Patient had a routine postpartum course. Patient is discharged home 09/24/19. Delivery time: 5:22 AM    Magnesium Sulfate received: No BMZ received:  No Rhophylac:No MMR:No Transfusion:No  Physical exam  Vitals:   09/23/19 1445 09/23/19 1601 09/23/19 1954 09/24/19 0520  BP: (!) 131/91 129/84 (!) 127/98 125/67  Pulse: 88 73 71 77  Resp: 18 18 19 18  Temp: 97.7 F (36.5 C)  98.4 F (36.9 C) 98.1 F (36.7 C)  TempSrc: Oral  Oral Oral  SpO2:   100% 100%  Weight:      Height:       General: alert, cooperative and no distress Lochia: appropriate Uterine Fundus: firm Incision: N/A DVT Evaluation: No evidence of DVT seen on physical exam. Labs: Lab Results  Component Value Date   WBC 8.3 09/22/2019   HGB 9.9 (L) 09/22/2019   HCT 32.4 (L) 09/22/2019   MCV 79.8 (L) 09/22/2019   PLT 188 09/22/2019   CMP Latest Ref Rng & Units 09/22/2019  Glucose 70 - 99 mg/dL 86  BUN 6 - 20 mg/dL 9  Creatinine 0.44 - 1.00 mg/dL 0.68  Sodium 135 - 145 mmol/L 138  Potassium 3.5 - 5.1 mmol/L 3.5  Chloride   98 - 111 mmol/L 109  CO2 22 - 32 mmol/L 19(L)  Calcium 8.9 - 10.3 mg/dL 9.0  Total Protein 6.5 - 8.1 g/dL 6.5  Total Bilirubin 0.3 - 1.2 mg/dL 0.5  Alkaline Phos 38 - 126 U/L 136(H)  AST 15 - 41 U/L 20  ALT 0 - 44 U/L 9   Edinburgh Score: Edinburgh Postnatal Depression Scale Screening Tool 09/22/2019  I have been able to laugh and see the funny side of things. (No Data)    Discharge instruction: per After Visit Summary and "Baby and Me Booklet".  After visit meds:  Allergies as of 09/24/2019   No Known Allergies     Medication List    TAKE these medications   acetaminophen 325 MG tablet Commonly known as: Tylenol Take 2 tablets (650 mg total) by mouth every 6 (six) hours as needed (for pain scale < 4).   amLODipine 5 MG tablet Commonly known as: NORVASC Take 1 tablet (5 mg total) by mouth daily.   ibuprofen 600 MG tablet Commonly known as: ADVIL Take 1 tablet (600 mg total) by mouth every 8 (eight) hours as needed for mild pain.   Prenatal Vitamin 27-0.8 MG Tabs Take 1 tablet by mouth daily.       Diet: No evidence  of DVT seen on physical exam.  Activity: Advance as tolerated. Pelvic rest for 6 weeks.   Outpatient follow up:4 weeks Follow up Appt: Future Appointments  Date Time Provider Department Center  10/24/2019  1:50 PM Dawson, Rolitta, CNM [facility] None   Follow up Visit:    Please schedule this patient for Postpartum visit in: 4 weeks with the following provider: Any provider Virtual For C/S patients schedule nurse incision check in weeks 2 weeks: no Low risk pregnancy complicated by: none Delivery mode:  SVD Anticipated Birth Control:  other/unsure PP Procedures needed: BP check Schedule Integrated BH visit: no     Newborn Data: Live born female  Birth Weight: 3615g   APGAR: 8, 9  Newborn Delivery   Birth date/time: 09/22/2019 05:22:00 Delivery type: Vaginal, Spontaneous      Baby Feeding: Bottle and Breast Disposition:home with mother   09/24/2019 Chelsea N Fair, MD   

## 2019-09-22 NOTE — Anesthesia Preprocedure Evaluation (Signed)
Anesthesia Evaluation  Patient identified by MRN, date of birth, ID band Patient awake    Reviewed: Allergy & Precautions, H&P , NPO status , Patient's Chart, lab work & pertinent test results  Airway Mallampati: II   Neck ROM: full    Dental   Pulmonary asthma , former smoker,    breath sounds clear to auscultation       Cardiovascular negative cardio ROS   Rhythm:regular Rate:Normal     Neuro/Psych    GI/Hepatic   Endo/Other  Morbid obesity  Renal/GU      Musculoskeletal   Abdominal   Peds  Hematology  (+) Blood dyscrasia, anemia ,   Anesthesia Other Findings   Reproductive/Obstetrics (+) Pregnancy                             Anesthesia Physical Anesthesia Plan  ASA: II  Anesthesia Plan: Epidural   Post-op Pain Management:    Induction: Intravenous  PONV Risk Score and Plan: 2 and Treatment may vary due to age or medical condition  Airway Management Planned: Natural Airway  Additional Equipment:   Intra-op Plan:   Post-operative Plan:   Informed Consent: I have reviewed the patients History and Physical, chart, labs and discussed the procedure including the risks, benefits and alternatives for the proposed anesthesia with the patient or authorized representative who has indicated his/her understanding and acceptance.       Plan Discussed with: Anesthesiologist  Anesthesia Plan Comments:         Anesthesia Quick Evaluation

## 2019-09-23 NOTE — Lactation Note (Signed)
This note was copied from a baby's chart. Lactation Consultation Note  Patient Name: Raven Medina CZYSA'Y Date: 09/23/2019 Reason for consult: Follow-up assessment;Term;Primapara;1st time breastfeeding  P1 mother whose infant is now 56 hours old.  This is a term baby.  Mother's feeding preference on admission was breast/bottle.  Upon chart review I noticed that mother has been formula feeding only.  Asked mother if she has changed her mind and would prefer to formula feed only now.  Mother replied, "I am not sure..I might try breast feeding."  There is conflicting evidence from previous LC's about mother's desire to breast feed vs formula feeding.  One note stated that she may want to pump and bottle feed only, however, she has not even initiated the pumping yet.  Since I needed to know so I could assist her in making the best possible feeding plan I offered to help baby latch and mother agreeable.  Mother's breasts are very large, soft and non tender and nipples are everted and intact.  Baby awake when I entered the room and very frantic.  She was acting voracious and father said it was time to feed.  Suggested STS and removed baby's clothing.  Attempted to latch in the cross cradle hold on the left breast but baby would not calm down to latch.  Suggested we give her a little bit of formula to calm her.  She consumed 20 mls easily, burped and continued to fuss and was very irritable.  Asked mother to feed her another 10 mls.  Baby then calmed down enough to attempt latching again.  She was able to latch but would only take 2 sucks before becoming frantic again.  Tried to place formula on nipple tip to entice her to continue sucking but was unsuccessful.  Repeated attempts two more times and suggested mother feed her a little bit more.  After another 10 mls she finally calmed down, burped and mother held her STS on her chest.  Reminded parents that this volume was much more than what I would  usually suggest for a newborn at this age.  However, it was needed to fulfill her needs and allow her to be peaceful and rest.    Suggested mother initiate pumping and continue to pump every three hours.  Mother did not wish to review pump, pump set up or cleaning.  She has a manual pump for home use and is planning on contacting a friend at the [facility] office who is planning on setting her up with a DEBP after discharge.  Father present.  RN updated.   Maternal Data Formula Feeding for Exclusion: Yes Reason for exclusion: Mother's choice to formula and breast feed on admission Has patient been taught Hand Expression?: Yes Does the patient have breastfeeding experience prior to this delivery?: No  Feeding Feeding Type: Breast Fed  LATCH Score Latch: Too sleepy or reluctant, no latch achieved, no sucking elicited.  Audible Swallowing: None  Type of Nipple: Everted at rest and after stimulation  Comfort (Breast/Nipple): Soft / non-tender  Hold (Positioning): Assistance needed to correctly position infant at breast and maintain latch.  LATCH Score: 5  Interventions Interventions: Breast feeding basics reviewed;Assisted with latch;Skin to skin;Adjust position;DEBP;Position options  Lactation Tools Discussed/Used WIC Program: Yes Pump Review: (Mother did not wish to review)   Consult Status Consult Status: Follow-up Date: 09/24/19 Follow-up type: In-patient    Dora Sims 09/23/2019, 2:36 PM

## 2019-09-23 NOTE — Clinical Social Work Maternal (Signed)
CLINICAL SOCIAL WORK MATERNAL/CHILD NOTE  Patient Details  Name: Raven Medina MRN: [id number] Date of Birth: 09/22/2000  Date:  09/23/2019  Clinical Social Worker Initiating Note:  Zaccheaus Storlie Date/Time: Initiated:  09/23/19/0937     Child's Name:  Raven Medina   Biological Parents:  Mother, Father(Raven Medina and Raven Medina DOB: 09/19/1983)   Need for Interpreter:  None   Reason for Referral:  Behavioral Health Concerns, Current Substance Use/Substance Use During Pregnancy    Address:  [address], [place], [place] [address]   Phone number:  [phone] (FOB's number) [phone] (MOB's number)     Additional phone number:   Household Members/Support Persons (HM/SP):   Household Member/Support Person 1, Household Member/Support Person 2, Household Member/Support Person 3, Household Member/Support Person 4   HM/SP Name Relationship DOB or Age  HM/SP -1 Raven Medina MOB's mother    HM/SP -2   MOB's father    HM/SP -3   Nephew    HM/SP -4 Raven Medina FOB 09/19/1983  HM/SP -5        HM/SP -6        HM/SP -7        HM/SP -8          Natural Supports (not living in the home):  Parent, Immediate Family   Professional Supports: None   Employment: Part-time   Type of Work: [employer]   Education:  High school graduate   Homebound arranged:    Financial Resources:  Medicaid   Other Resources:  WIC   Cultural/Religious Considerations Which May Impact Care:    Strengths:  Ability to meet basic needs , Home prepared for child    Psychotropic Medications:         Pediatrician:       Pediatrician List:   [place]    [place]    [place]    [place]    [place]    [place]      Pediatrician Fax Number:    Risk Factors/Current Problems:  Mental Health Concerns , Substance Use    Cognitive State:  Able to Concentrate , Alert , Linear Thinking    Mood/Affect:  Calm , Comfortable ,  Interested , Relaxed    CSW Assessment:  CSW received consult for THC use during pregnancy and history of adjustment disorder. CSW met with MOB to offer support and complete assessment.    MOB laying in bed holding infant with FOB present at bedside, when CSW entered the room. CSW introduced self and received verbal permission to have FOB step out of the room so that CSW could meet with MOB alone. FOB understanding and left voluntarily. CSW explained reason for consult to which MOB expressed understanding. MOB reported she and FOB are currently staying with MOB's parents and MOB's nephew in [place]. MOB stated she currently works part-time at [employer]. MOB confirmed she receives WIC and is aware she would need to call and update them of her delivery. CSW inquired about MOB's mental health history and MOB denied having any. MOB denied any symptoms during her pregnancy and denied any current symptoms or concerns. CSW provided education regarding the baby blues period vs. perinatal mood disorders, discussed treatment and gave resources for mental health follow up if concerns arise. CSW recommended self-evaluation during the postpartum time period using the New Mom Checklist from Postpartum Progress and encouraged MOB to contact a medical professional if symptoms are noted at any time. MOB did not appear to be   displaying any acute mental health symptoms and denied any current SI, HI or DV. MOB denied any safety concerns in current relationship or environment. MOB reported having good support from FOB, her sister and her mother. MOB confirmed having all essential items for infant once discharged and stated infant would be sleeping in a crib once home. CSW provided review of Sudden Infant Death Syndrome (SIDS) precautions and safe sleeping habits.  CSW inquired about MOB's substance use during pregnancy and MOB acknowledged use of marijuana in early pregnancy but denied any use after finding out she was  pregnant. CSW informed MOB of Hospital Drug Policy and explained UDS was negative but that CDS would continue to be monitored and a CPS report would be made, if warranted. MOB denied any questions or concerns regarding policy.   CSW to continue to monitor CDS results and make CPS report, if needed.      CSW Plan/Description:  No Further Intervention Required/No Barriers to Discharge, Sudden Infant Death Syndrome (SIDS) Education, Perinatal Mood and Anxiety Disorder (PMADs) Education, Hospital Drug Screen Policy Information, CSW Will Continue to Monitor Umbilical Cord Tissue Drug Screen Results and Make Report if Warranted    Karington Zarazua, LCSW 09/23/2019, 11:44 AM  

## 2019-09-23 NOTE — Anesthesia Postprocedure Evaluation (Signed)
Anesthesia Post Note  Patient: Raven Medina  Procedure(s) Performed: AN AD HOC LABOR EPIDURAL     Patient location during evaluation: Mother Baby Anesthesia Type: Epidural Level of consciousness: awake and alert Pain management: pain level controlled Vital Signs Assessment: post-procedure vital signs reviewed and stable Respiratory status: spontaneous breathing, nonlabored ventilation and respiratory function stable Cardiovascular status: stable Postop Assessment: no headache, no backache, epidural receding, able to ambulate, adequate PO intake and no apparent nausea or vomiting Anesthetic complications: no    Last Vitals:  Vitals:   09/22/19 2138 09/23/19 0527  BP: 121/85 124/72  Pulse: (!) 103 79  Resp: 18 16  Temp: 36.8 C 36.7 C  SpO2:  99%    Last Pain:  Vitals:   09/23/19 0830  TempSrc:   PainSc: 0-No pain   Pain Goal: Patients Stated Pain Goal: 3 (09/22/19 1156)                 Blythe Stanford

## 2019-09-23 NOTE — Progress Notes (Addendum)
POSTPARTUM PROGRESS NOTE  Subjective: Raven Medina is a 19 y.o. G1P1001 s/p VD at [redacted]w[redacted]d.  She reports she doing well. No acute events overnight. She denies any problems with ambulating, voiding or po intake. Denies nausea or vomiting. Pain is well controlled.  Lochia is similar to menses.  Objective: Blood pressure 124/72, pulse 79, temperature 98 F (36.7 C), temperature source Oral, resp. rate 16, height 5\' 5"  (1.651 m), weight 105 kg, last menstrual period 12/09/2018, SpO2 99 %, unknown if currently breastfeeding.  Physical Exam:  General: alert, cooperative and no distress Chest: no respiratory distress Abdomen: soft, non-tender  Uterine Fundus: firm, appropriately tender Extremities: No calf swelling or tenderness  Recent Labs    09/22/19 0059  HGB 9.9*  HCT 32.4*    Assessment/Plan: Raven Medina is a 19 y.o. G1P1001 s/p VD at [redacted]w[redacted]d for PD IOL.  Routine Postpartum Care: Doing well, pain well-controlled.  -- Continue routine care, lactation support  -- Contraception: declines -- Feeding: both  Dispo: Plan for discharge tomorrow.  [redacted]w[redacted]d, DO Resident, [facility]  I saw and evaluated the patient. I agree with the findings and the plan of care as documented in the resident's note.  [facility], MD [facility] Fellow, [facility] for [facility]

## 2019-09-24 DIAGNOSIS — O139 Gestational [pregnancy-induced] hypertension without significant proteinuria, unspecified trimester: Secondary | ICD-10-CM

## 2019-09-24 MED ORDER — ACETAMINOPHEN 325 MG PO TABS: 650.0000 mg | ORAL_TABLET | Freq: Four times a day (QID) | ORAL | 0 refills | Status: DC | PRN

## 2019-09-24 MED ORDER — AMLODIPINE BESYLATE 5 MG PO TABS: 5.0000 mg | ORAL_TABLET | Freq: Every day | ORAL | 0 refills | Status: DC

## 2019-09-24 MED ORDER — IBUPROFEN 600 MG PO TABS: 600.0000 mg | ORAL_TABLET | Freq: Three times a day (TID) | ORAL | 0 refills | Status: DC | PRN

## 2019-09-24 MED ORDER — AMLODIPINE BESYLATE 5 MG PO TABS
5.0000 mg | ORAL_TABLET | Freq: Every day | ORAL | Status: DC
  Administered 2019-09-24: 12:00:00 5 mg via ORAL
  Filled 2019-09-24: qty 1

## 2019-09-24 MED FILL — IBUPROFEN 600 MG TABS: 600 | 10 days supply | Qty: 30 | Fill #0

## 2019-09-24 MED FILL — ACETAMINOPHEN 325 MG TABS: 325 | 4 days supply | Qty: 30 | Fill #0

## 2019-09-24 MED FILL — AMLODIPINE BESYLATE 5 MG TA: 5 | 60 days supply | Qty: 60 | Fill #0

## 2019-09-24 NOTE — Lactation Note (Signed)
This note was copied from a baby's chart. Lactation Consultation Note  Patient Name: Girl Liliann File [id number] Date: 09/24/2019 Reason for consult: Follow-up assessment Baby is 51 hours old/1% weight loss.  Mom has been formula feeding baby but states baby latched this morning.  Discussed milk coming to volume and the prevention and treatment of engorgement.  Mom has a manual pump for home use.  Instructed to pump breasts every 3 hours if baby does not feed well.  No questions or concerns.  Reviewed outpatient services and encouraged to call prn.  Maternal Data    Feeding Feeding Type: Bottle Fed - Formula  LATCH Score Latch: Repeated attempts needed to sustain latch, nipple held in mouth throughout feeding, stimulation needed to elicit sucking reflex.  Audible Swallowing: Spontaneous and intermittent  Type of Nipple: Everted at rest and after stimulation  Comfort (Breast/Nipple): Soft / non-tender  Hold (Positioning): Assistance needed to correctly position infant at breast and maintain latch.  LATCH Score: 8  Interventions    Lactation Tools Discussed/Used     Consult Status Consult Status: Complete Follow-up type: Call as needed    Huston Foley 09/24/2019, 9:06 AM

## 2019-10-01 ENCOUNTER — Ambulatory Visit: Payer: Medicaid Other

## 2019-10-01 ENCOUNTER — Other Ambulatory Visit: Payer: Self-pay

## 2019-10-01 ENCOUNTER — Telehealth: Payer: Self-pay | Admitting: General Practice

## 2019-10-01 NOTE — Telephone Encounter (Signed)
Left message on VM for pt to give our office a call with blood pressure reading.  Pt was scheduled for Telehealth visit for BP check and did not answer.

## 2019-10-02 ENCOUNTER — Ambulatory Visit: Payer: Medicaid Other

## 2019-10-24 ENCOUNTER — Encounter: Payer: Self-pay | Admitting: Obstetrics and Gynecology

## 2019-10-24 ENCOUNTER — Telehealth (INDEPENDENT_AMBULATORY_CARE_PROVIDER_SITE_OTHER): Payer: Medicaid Other | Admitting: Obstetrics and Gynecology

## 2019-10-24 DIAGNOSIS — Z1389 Encounter for screening for other disorder: Secondary | ICD-10-CM | POA: Diagnosis not present

## 2019-10-24 NOTE — Progress Notes (Signed)
MY CHART VIDEO POSTPARTUM VISIT ENCOUNTER NOTE  I connected with@ on 10/24/19 at  1:50 PM EDT by My Chart video at home and verified that I am speaking with the correct person using two identifiers.   I discussed the limitations, risks, security and privacy concerns of performing an evaluation and management service by My Chart video and the availability of in person appointments. I also discussed with the patient that there may be a patient responsible charge related to this service. The patient expressed understanding and agreed to proceed.  Appointment Date: 10/28/2019  OBGYN Clinic: [facility]  Chief Complaint:  Postpartum Visit  History of Present Illness: Raven Medina is a 19 y.o. African-American G1P1001 (No LMP recorded.), seen for the above chief complaint. Her past medical history is significant for    She is s/p normal spontaneous vaginal delivery on 09/22/2019 at 41 weeks; she was discharged to home on 3/2/2021D#3. Pregnancy complicated by none. Baby is doing well.  Complains of none  Vaginal bleeding or discharge: Yes  Mode of feeding infant: Bottle Intercourse: No  Contraception: no method PP depression s/s: No .  Any bowel or bladder issues: No  Pap smear: N/A (date: N/A)  Review of Systems: Her 12 point review of systems is negative or as noted in the History of Present Illness.  Patient Active Problem List   Diagnosis Date Noted  . Gestational hypertension 09/24/2019  . [redacted] weeks gestation of pregnancy 09/22/2019  . Post-dates pregnancy 09/22/2019  . Positive GBS test 09/11/2019  . Traumatic injury during pregnancy in third trimester 07/21/2019  . NST (non-stress test) reactive on fetal surveillance 07/21/2019  . Blunt abdominal trauma 05/15/2019  . Asthma affecting pregnancy in first trimester 02/27/2019  . Anemia in pregnancy, antepartum 02/27/2019  . Supervision of normal first pregnancy, antepartum 02/19/2019  . Acne vulgaris 01/11/2019  .  Adjustment disorder with anxiety 07/10/2015  . ADHD (attention deficit hyperactivity disorder) 12/05/2012    Medications Raven Medina had no medications administered during this visit. Current Outpatient Medications  Medication Sig Dispense Refill  . acetaminophen (TYLENOL) 325 MG tablet Take 2 tablets (650 mg total) by mouth every 6 (six) hours as needed (for pain scale < 4). 30 tablet 0  . ibuprofen (ADVIL) 600 MG tablet Take 1 tablet (600 mg total) by mouth every 8 (eight) hours as needed for mild pain. 30 tablet 0  . amLODipine (NORVASC) 5 MG tablet Take 1 tablet (5 mg total) by mouth daily. (Patient not taking: Reported on 10/24/2019) 60 tablet 0  . Prenatal Vit-Fe Fumarate-FA (PRENATAL VITAMIN) 27-0.8 MG TABS Take 1 tablet by mouth daily. (Patient not taking: Reported on 10/24/2019) 30 tablet 9   No current facility-administered medications for this visit.    Allergies Patient has no known allergies.  Physical Exam:  General:  Alert, oriented and cooperative.   Mental Status: Normal mood and affect perceived. Normal judgment and thought content.  Rest of physical exam deferred due to type of encounter  PP Depression Screening:   Edinburgh Postnatal Depression Scale - 10/24/19 1405      Edinburgh Postnatal Depression Scale:  In the Past 7 Days   I have been able to laugh and see the funny side of things.  0    I have looked forward with enjoyment to things.  0    I have blamed myself unnecessarily when things went wrong.  1    I have been anxious or worried for no good reason.  1    I have felt scared or panicky for no good reason.  0    Things have been getting on top of me.  0    I have been so unhappy that I have had difficulty sleeping.  1    I have felt sad or miserable.  0    I have been so unhappy that I have been crying.  0    The thought of harming myself has occurred to me.  0    Edinburgh Postnatal Depression Scale Total  3       Assessment:Patient is a 19  y.o. G1P1001 who is 4 weeks postpartum from a normal spontaneous vaginal delivery.  She is doing well.   Plan: Encounter for postpartum visit - Advised to check BP daily and message CNM on Saturday 10/26/19   RTC 1 year or sooner  I discussed the assessment and treatment plan with the patient. The patient was provided an opportunity to ask questions and all were answered. The patient agreed with the plan and demonstrated an understanding of the instructions.   The patient was advised to call back or seek an in-person evaluation/go to the ED for any concerning postpartum symptoms.  I provided 10 minutes of non-face-to-face time during this encounter. There was 5 minutes of chart review time spent prior to this encounter. Total time spent = 15 minutes.    Raven Medina, [place] for [place]

## 2019-10-24 NOTE — Patient Instructions (Signed)
Postpartum Hypertension Postpartum hypertension is high blood pressure that remains higher than normal after childbirth. You may not realize that you have postpartum hypertension if your blood pressure is not being checked regularly. In most cases, postpartum hypertension will go away on its own, usually within a week of delivery. However, for some women, medical treatment is required to prevent serious complications, such as seizures or stroke. What are the causes? This condition may be caused by one or more of the following:  Hypertension that existed before pregnancy (chronic hypertension).  Hypertension that comes on as a result of pregnancy (gestational hypertension).  Hypertensive disorders during pregnancy (preeclampsia) or seizures in women who have high blood pressure during pregnancy (eclampsia).  A condition in which the liver, platelets, and red blood cells are damaged during pregnancy (HELLP syndrome).  A condition in which the thyroid produces too much hormones (hyperthyroidism).  Other rare problems of the nerves (neurological disorders) or blood disorders. In some cases, the cause may not be known. What increases the risk? The following factors may make you more likely to develop this condition:  Chronic hypertension. In some cases, this may not have been diagnosed before pregnancy.  Obesity.  Type 2 diabetes.  Kidney disease.  History of preeclampsia or eclampsia.  Other medical conditions that change the level of hormones in the body (hormonal imbalance). What are the signs or symptoms? As with all types of hypertension, postpartum hypertension may not have any symptoms. Depending on how high your blood pressure is, you may experience:  Headaches. These may be mild, moderate, or severe. They may also be steady, constant, or sudden in onset (thunderclap headache).  Changes in your ability to see (visual changes).  Dizziness.  Shortness of breath.  Swelling  of your hands, feet, lower legs, or face. In some cases, you may have swelling in more than one of these locations.  Heart palpitations or a racing heartbeat.  Difficulty breathing while lying down.  Decrease in the amount of urine that you pass. Other rare signs and symptoms may include:  Sweating more than usual. This lasts longer than a few days after delivery.  Chest pain.  Sudden dizziness when you get up from sitting or lying down.  Seizures.  Nausea or vomiting.  Abdominal pain. How is this diagnosed? This condition may be diagnosed based on the results of a physical exam, blood pressure measurements, and blood and urine tests. You may also have other tests, such as a CT scan or an MRI, to check for other problems of postpartum hypertension. How is this treated? If blood pressure is high enough to require treatment, your options may include:  Medicines to reduce blood pressure (antihypertensives). Tell your health care provider if you are breastfeeding or if you plan to breastfeed. There are many antihypertensive medicines that are safe to take while breastfeeding.  Stopping medicines that may be causing hypertension.  Treating medical conditions that are causing hypertension.  Treating the complications of hypertension, such as seizures, stroke, or kidney problems. Your health care provider will also continue to monitor your blood pressure closely until it is within a safe range for you. Follow these instructions at home:  Take over-the-counter and prescription medicines only as told by your health care provider.  Return to your normal activities as told by your health care provider. Ask your health care provider what activities are safe for you.  Do not use any products that contain nicotine or tobacco, such as cigarettes and e-cigarettes. If   you need help quitting, ask your health care provider.  Keep all follow-up visits as told by your health care provider. This  is important. Contact a health care provider if:  Your symptoms get worse.  You have new symptoms, such as: ? A headache that does not get better. ? Dizziness. ? Visual changes. Get help right away if:  You suddenly develop swelling in your hands, ankles, or face.  You have sudden, rapid weight gain.  You develop difficulty breathing, chest pain, racing heartbeat, or heart palpitations.  You develop severe pain in your abdomen.  You have any symptoms of a stroke. "BE FAST" is an easy way to remember the main warning signs of a stroke: ? B - Balance. Signs are dizziness, sudden trouble walking, or loss of balance. ? E - Eyes. Signs are trouble seeing or a sudden change in vision. ? F - Face. Signs are sudden weakness or numbness of the face, or the face or eyelid drooping on one side. ? A - Arms. Signs are weakness or numbness in an arm. This happens suddenly and usually on one side of the body. ? S - Speech. Signs are sudden trouble speaking, slurred speech, or trouble understanding what people say. ? T - Time. Time to call emergency services. Write down what time symptoms started.  You have other signs of a stroke, such as: ? A sudden, severe headache with no known cause. ? Nausea or vomiting. ? Seizure. These symptoms may represent a serious problem that is an emergency. Do not wait to see if the symptoms will go away. Get medical help right away. Call your local emergency services (911 in the [place] Do not drive yourself to the hospital. Summary  Postpartum hypertension is high blood pressure that remains higher than normal after childbirth.  In most cases, postpartum hypertension will go away on its own, usually within a week of delivery.  For some women, medical treatment is required to prevent serious complications, such as seizures or stroke. This information is not intended to replace advice given to you by your health care provider. Make sure you discuss any questions  you have with your health care provider. Document Revised: 08/17/2018 Document Reviewed: 05/01/2017 Elsevier Patient Education  2020 [employer]  

## 2019-12-09 ENCOUNTER — Encounter: Payer: Self-pay | Admitting: Pediatrics

## 2019-12-24 ENCOUNTER — Encounter: Payer: Self-pay | Admitting: General Practice

## 2019-12-24 ENCOUNTER — Other Ambulatory Visit: Payer: Self-pay

## 2019-12-24 ENCOUNTER — Ambulatory Visit (INDEPENDENT_AMBULATORY_CARE_PROVIDER_SITE_OTHER): Payer: Medicaid Other | Admitting: *Deleted

## 2019-12-24 VITALS — BP 123/76 | HR 92 | Temp 97.9°F | Ht 65.0 in | Wt 199.0 lb

## 2019-12-24 DIAGNOSIS — Z3200 Encounter for pregnancy test, result unknown: Secondary | ICD-10-CM

## 2019-12-24 DIAGNOSIS — O0990 Supervision of high risk pregnancy, unspecified, unspecified trimester: Secondary | ICD-10-CM | POA: Insufficient documentation

## 2019-12-24 DIAGNOSIS — Z3480 Encounter for supervision of other normal pregnancy, unspecified trimester: Secondary | ICD-10-CM

## 2019-12-24 DIAGNOSIS — Z3201 Encounter for pregnancy test, result positive: Secondary | ICD-10-CM

## 2019-12-24 DIAGNOSIS — Z789 Other specified health status: Secondary | ICD-10-CM

## 2019-12-24 LAB — POCT URINE PREGNANCY: Preg Test, Ur: POSITIVE — AB

## 2019-12-24 MED ORDER — PRENATAL PLUS/IRON 27-1 MG PO TABS: 1.0000 | ORAL_TABLET | Freq: Every day | ORAL | 12 refills | Status: DC

## 2019-12-24 MED ORDER — GOJJI WEIGHT SCALE MISC: 1.0000 | Freq: Every day | 0 refills | Status: DC | PRN

## 2019-12-24 NOTE — Patient Instructions (Addendum)
Morning Sickness  Morning sickness is when you feel sick to your stomach (nauseous) during pregnancy. You may feel sick to your stomach and throw up (vomit). You may feel sick in the morning, but you can feel this way at any time of day. Some women feel very sick to their stomach and cannot stop throwing up (hyperemesis gravidarum). Follow these instructions at home: Medicines  Take over-the-counter and prescription medicines only as told by your doctor. Do not take any medicines until you talk with your doctor about them first.  Taking multivitamins before getting pregnant can stop or lessen the harshness of morning sickness. Eating and drinking  Eat dry toast or crackers before getting out of bed.  Eat 5 or 6 small meals a day.  Eat dry and bland foods like rice and baked potatoes.  Do not eat greasy, fatty, or spicy foods.  Have someone cook for you if the smell of food causes you to feel sick or throw up.  If you feel sick to your stomach after taking prenatal vitamins, take them at night or with a snack.  Eat protein when you need a snack. Nuts, yogurt, and cheese are good choices.  Drink fluids throughout the day.  Try ginger ale made with real ginger, ginger tea made from fresh grated ginger, or ginger candies. General instructions  Do not use any products that have nicotine or tobacco in them, such as cigarettes and e-cigarettes. If you need help quitting, ask your doctor.  Use an air purifier to keep the air in your house free of smells.  Get lots of fresh air.  Try to avoid smells that make you feel sick.  Try: ? Wearing a bracelet that is used for seasickness (acupressure wristband). ? Going to a doctor who puts thin needles into certain body points (acupuncture) to improve how you feel. Contact a doctor if:  You need medicine to feel better.  You feel dizzy or light-headed.  You are losing weight. Get help right away if:  You feel very sick to your  stomach and cannot stop throwing up.  You pass out (faint).  You have very bad pain in your belly. Summary  Morning sickness is when you feel sick to your stomach (nauseous) during pregnancy.  You may feel sick in the morning, but you can feel this way at any time of day.  Making some changes to what you eat may help your symptoms go away. This information is not intended to replace advice given to you by your health care provider. Make sure you discuss any questions you have with your health care provider. Document Revised: 06/23/2017 Document Reviewed: 08/11/2016 Elsevier Patient Education  2020 [employer].  Warning Signs During Pregnancy A pregnancy lasts about 40 weeks, starting from the first day of your last period until the baby is born. Pregnancy is divided into three phases called trimesters.  The first trimester refers to week 1 through week 13 of pregnancy.  The second trimester is the start of week 14 through the end of week 27.  The third trimester is the start of week 28 until you deliver your baby. During each trimester of pregnancy, certain signs and symptoms may indicate a problem. Talk with your health care provider about your current health and any medical conditions you have. Make sure you know the symptoms that you should watch for and report. How does this affect me?  Warning signs in the first trimester While some changes  during the first trimester may be uncomfortable, most do not represent a serious problem. Let your health care provider know if you have any of the following warning signs in the first trimester:  You cannot eat or drink without vomiting, and this lasts for longer than a day.  You have vaginal bleeding or spotting along with menstrual-like cramping.  You have diarrhea for longer than a day.  You have a fever or other signs of infection, such as: ? Pain or burning when you urinate. ? Foul smelling or thick or yellowish vaginal  discharge. Warning signs in the second trimester As your baby grows and changes during the second trimester, there are additional signs and symptoms that may indicate a problem. These include:  Signs and symptoms of infection, including a fever.  Signs or symptoms of a miscarriage or preterm labor, such as regular contractions, menstrual-like cramping, or lower abdominal pain.  Bloody or watery vaginal discharge or obvious vaginal bleeding.  Feeling like your heart is pounding.  Having trouble breathing.  Nausea, vomiting, or diarrhea that lasts for longer than a day.  Craving non-food items, such as clay, chalk, or dirt. This may be a sign of a very treatable medical condition called pica. Later in your second trimester, watch for signs and symptoms of a serious medical condition called preeclampsia.These include:  Changes in your vision.  A severe headache that does not go away.  Nausea and vomiting. It is also important to notice if your baby stops moving or moves less than usual during this time. Warning signs in the third trimester As you approach the third trimester, your baby is growing and your body is preparing for the birth of your baby. In your third trimester, be sure to let your health care provider know if:  You have signs and symptoms of infection, including a fever.  You have vaginal bleeding.  You notice that your baby is moving less than usual or is not moving.  You have nausea, vomiting, or diarrhea that lasts for longer than a day.  You have a severe headache that does not go away.  You have vision changes, including seeing spots or having blurry or double vision.  You have increased swelling in your hands or face. How does this affect my baby? Throughout your pregnancy, always report any of the warning signs of a problem to your health care provider. This can help prevent complications that may affect your baby, including:  Increased risk for premature  birth.  Infection that may be transmitted to your baby.  Increased risk for stillbirth. Contact a health care provider if:  You have any of the warning signs of a problem for the current trimester of your pregnancy.  Any of the following apply to you during any trimester of pregnancy: ? You have strong emotions, such as sadness or anxiety, that interfere with work or personal relationships. ? You feel unsafe in your home and need help finding a safe place to live. ? You are using tobacco products, alcohol, or drugs and you need help to stop. Get help right away if: You have signs or symptoms of labor before 37 weeks of pregnancy. These include:  Contractions that are 5 minutes or less apart, or that increase in frequency, intensity, or length.  Sudden, sharp abdominal pain or low back pain.  Uncontrolled gush or trickle of fluid from your vagina. Summary  A pregnancy lasts about 40 weeks, starting from the first day of your last  period until the baby is born. Pregnancy is divided into three phases called trimesters. Each trimester has warning signs to watch for.  Always report any warning signs to your health care provider in order to prevent complications that may affect both you and your baby.  Talk with your health care provider about your current health and any medical conditions you have. Make sure you know the symptoms that you should watch for and report. This information is not intended to replace advice given to you by your health care provider. Make sure you discuss any questions you have with your health care provider. Document Revised: 10/30/2018 Document Reviewed: 04/27/2017 Elsevier Patient Education  2020 [employer].  First Trimester of Pregnancy  The first trimester of pregnancy is from week 1 until the end of week 13 (months 1 through 3). During this time, your baby will begin to develop inside you. At 6-8 weeks, the eyes and face are formed, and the heartbeat  can be seen on ultrasound. At the end of 12 weeks, all the baby's organs are formed. Prenatal care is all the medical care you receive before the birth of your baby. Make sure you get good prenatal care and follow all of your doctor's instructions. Follow these instructions at home: Medicines  Take over-the-counter and prescription medicines only as told by your doctor. Some medicines are safe and some medicines are not safe during pregnancy.  Take a prenatal vitamin that contains at least 600 micrograms (mcg) of folic acid.  If you have trouble pooping (constipation), take medicine that will make your stool soft (stool softener) if your doctor approves. Eating and drinking   Eat regular, healthy meals.  Your doctor will tell you the amount of weight gain that is right for you.  Avoid raw meat and uncooked cheese.  If you feel sick to your stomach (nauseous) or throw up (vomit): ? Eat 4 or 5 small meals a day instead of 3 large meals. ? Try eating a few soda crackers. ? Drink liquids between meals instead of during meals.  To prevent constipation: ? Eat foods that are high in fiber, like fresh fruits and vegetables, whole grains, and beans. ? Drink enough fluids to keep your pee (urine) clear or pale yellow. Activity  Exercise only as told by your doctor. Stop exercising if you have cramps or pain in your lower belly (abdomen) or low back.  Do not exercise if it is too hot, too humid, or if you are in a place of great height (high altitude).  Try to avoid standing for long periods of time. Move your legs often if you must stand in one place for a long time.  Avoid heavy lifting.  Wear low-heeled shoes. Sit and stand up straight.  You can have sex unless your doctor tells you not to. Relieving pain and discomfort  Wear a good support bra if your breasts are sore.  Take warm water baths (sitz baths) to soothe pain or discomfort caused by hemorrhoids. Use hemorrhoid cream if  your doctor says it is okay.  Rest with your legs raised if you have leg cramps or low back pain.  If you have puffy, bulging veins (varicose veins) in your legs: ? Wear support hose or compression stockings as told by your doctor. ? Raise (elevate) your feet for 15 minutes, 3-4 times a day. ? Limit salt in your food. Prenatal care  Schedule your prenatal visits by the twelfth week of pregnancy.  Write down  your questions. Take them to your prenatal visits.  Keep all your prenatal visits as told by your doctor. This is important. Safety  Wear your seat belt at all times when driving.  Make a list of emergency phone numbers. The list should include numbers for family, friends, the hospital, and police and fire departments. General instructions  Ask your doctor for a referral to a local prenatal class. Begin classes no later than at the start of month 6 of your pregnancy.  Ask for help if you need counseling or if you need help with nutrition. Your doctor can give you advice or tell you where to go for help.  Do not use hot tubs, steam rooms, or saunas.  Do not douche or use tampons or scented sanitary pads.  Do not cross your legs for long periods of time.  Avoid all herbs and alcohol. Avoid drugs that are not approved by your doctor.  Do not use any tobacco products, including cigarettes, chewing tobacco, and electronic cigarettes. If you need help quitting, ask your doctor. You may get counseling or other support to help you quit.  Avoid cat litter boxes and soil used by cats. These carry germs that can cause birth defects in the baby and can cause a loss of your baby (miscarriage) or stillbirth.  Visit your dentist. At home, brush your teeth with a soft toothbrush. Be gentle when you floss. Contact a doctor if:  You are dizzy.  You have mild cramps or pressure in your lower belly.  You have a nagging pain in your belly area.  You continue to feel sick to your stomach,  you throw up, or you have watery poop (diarrhea).  You have a bad smelling fluid coming from your vagina.  You have pain when you pee (urinate).  You have increased puffiness (swelling) in your face, hands, legs, or ankles. Get help right away if:  You have a fever.  You are leaking fluid from your vagina.  You have spotting or bleeding from your vagina.  You have very bad belly cramping or pain.  You gain or lose weight rapidly.  You throw up blood. It may look like coffee grounds.  You are around people who have [place] measles, fifth disease, or chickenpox.  You have a very bad headache.  You have shortness of breath.  You have any kind of trauma, such as from a fall or a car accident. Summary  The first trimester of pregnancy is from week 1 until the end of week 13 (months 1 through 3).  To take care of yourself and your unborn baby, you will need to eat healthy meals, take medicines only if your doctor tells you to do so, and do activities that are safe for you and your baby.  Keep all follow-up visits as told by your doctor. This is important as your doctor will have to ensure that your baby is healthy and growing well. This information is not intended to replace advice given to you by your health care provider. Make sure you discuss any questions you have with your health care provider. Document Revised: 11/01/2018 Document Reviewed: 07/19/2016 Elsevier Patient Education  2020 [employer].

## 2019-12-24 NOTE — Progress Notes (Signed)
   PRENATAL INTAKE SUMMARY  Ms. Aronov presents today New OB Nurse Interview.  OB History    Gravida  2   Para  1   Term  1   Preterm      AB      Living  1     SAB      TAB      Ectopic      Multiple  0   Live Births  1          I have reviewed the patient's medical, obstetrical, social, and family histories, medications, and available lab results.  SUBJECTIVE She has no unusual complaints and complains of nausea with vomiting.  OBJECTIVE Initial nurse interview for history and urine pregnancy test (New OB)  GENERAL APPEARANCE: alert, well appearing, in no apparent distress, oriented to person, place and time.  EDD: unknown LMP G2P1001  ASSESSMENT Positive urine Pregnancy test Normal pregnancy  PLAN Prenatal care-[facility] Declined medication for nausea. Labs to be completed at next visit with Raelyn Mora, CNM 01/23/20 Ultrasound <14 weeks ordered to confirm dating due to unknown LMP and recent delivery 09/22/19 Enroll in Babys[employer]s Rx for PNV sent to pharmacy Rx for weight scale sent to [facility]; Has BP monitor at home.  Clovis Pu, RN

## 2019-12-27 DIAGNOSIS — O0990 Supervision of high risk pregnancy, unspecified, unspecified trimester: Secondary | ICD-10-CM | POA: Diagnosis not present

## 2019-12-31 ENCOUNTER — Other Ambulatory Visit: Payer: Self-pay

## 2019-12-31 ENCOUNTER — Other Ambulatory Visit: Payer: Self-pay | Admitting: Obstetrics and Gynecology

## 2019-12-31 ENCOUNTER — Ambulatory Visit
Admission: RE | Admit: 2019-12-31 | Discharge: 2019-12-31 | Disposition: A | Discharge status: Home / Self Care | Payer: Medicaid Other | Source: Ambulatory Visit | Attending: Obstetrics and Gynecology | Admitting: Obstetrics and Gynecology

## 2019-12-31 DIAGNOSIS — Z789 Other specified health status: Secondary | ICD-10-CM

## 2019-12-31 DIAGNOSIS — Z3A08 8 weeks gestation of pregnancy: Secondary | ICD-10-CM | POA: Diagnosis not present

## 2019-12-31 DIAGNOSIS — Z3491 Encounter for supervision of normal pregnancy, unspecified, first trimester: Secondary | ICD-10-CM | POA: Diagnosis not present

## 2019-12-31 DIAGNOSIS — Z3480 Encounter for supervision of other normal pregnancy, unspecified trimester: Secondary | ICD-10-CM | POA: Insufficient documentation

## 2019-12-31 DIAGNOSIS — O30041 Twin pregnancy, dichorionic/diamniotic, first trimester: Secondary | ICD-10-CM | POA: Diagnosis not present

## 2020-01-23 ENCOUNTER — Other Ambulatory Visit (HOSPITAL_COMMUNITY)
Admission: RE | Admit: 2020-01-23 | Discharge: 2020-01-23 | Disposition: A | Discharge status: Home / Self Care | Payer: Medicaid Other | Source: Ambulatory Visit | Attending: Obstetrics and Gynecology | Admitting: Obstetrics and Gynecology

## 2020-01-23 ENCOUNTER — Encounter: Payer: Self-pay | Admitting: General Practice

## 2020-01-23 ENCOUNTER — Other Ambulatory Visit: Payer: Self-pay

## 2020-01-23 ENCOUNTER — Encounter: Payer: Self-pay | Admitting: Obstetrics and Gynecology

## 2020-01-23 ENCOUNTER — Ambulatory Visit (INDEPENDENT_AMBULATORY_CARE_PROVIDER_SITE_OTHER): Payer: Medicaid Other | Admitting: Obstetrics and Gynecology

## 2020-01-23 ENCOUNTER — Other Ambulatory Visit: Payer: Self-pay | Admitting: Obstetrics and Gynecology

## 2020-01-23 VITALS — BP 111/70 | HR 105 | Temp 98.1°F | Wt 199.2 lb

## 2020-01-23 DIAGNOSIS — O30002 Twin pregnancy, unspecified number of placenta and unspecified number of amniotic sacs, second trimester: Secondary | ICD-10-CM

## 2020-01-23 DIAGNOSIS — O26891 Other specified pregnancy related conditions, first trimester: Secondary | ICD-10-CM

## 2020-01-23 DIAGNOSIS — O0990 Supervision of high risk pregnancy, unspecified, unspecified trimester: Secondary | ICD-10-CM

## 2020-01-23 DIAGNOSIS — O09891 Supervision of other high risk pregnancies, first trimester: Secondary | ICD-10-CM

## 2020-01-23 DIAGNOSIS — Z3A13 13 weeks gestation of pregnancy: Secondary | ICD-10-CM | POA: Diagnosis not present

## 2020-01-23 DIAGNOSIS — Z3480 Encounter for supervision of other normal pregnancy, unspecified trimester: Secondary | ICD-10-CM

## 2020-01-23 DIAGNOSIS — O30049 Twin pregnancy, dichorionic/diamniotic, unspecified trimester: Secondary | ICD-10-CM | POA: Insufficient documentation

## 2020-01-23 DIAGNOSIS — O30001 Twin pregnancy, unspecified number of placenta and unspecified number of amniotic sacs, first trimester: Secondary | ICD-10-CM | POA: Diagnosis not present

## 2020-01-23 DIAGNOSIS — Z789 Other specified health status: Secondary | ICD-10-CM

## 2020-01-23 DIAGNOSIS — Z3481 Encounter for supervision of other normal pregnancy, first trimester: Secondary | ICD-10-CM | POA: Diagnosis not present

## 2020-01-23 NOTE — Progress Notes (Signed)
INITIAL OBSTETRICAL VISIT Patient name: Raven Medina MRN [id number]  Date of birth: 10-12-2000 Chief Complaint:   Initial Prenatal Visit  History of Present Illness:   Alonah Lineback is a 19 y.o. G78P1001 African American female at 45w0dby  with an Estimated Date of Delivery: 07/30/20 being seen today for her initial obstetrical visit.  Her obstetrical history is significant for closely spaced pregnancies (child is 460 monthsold). This is an unplanned pregnancy; different paternity than the first pregnancy. She and the father of the baby (FOB) "ROval Linsey live together. She has a support system that consists [facility]/father/friends. Today she reports no complaints and "just still in shock that I am having twins and will have 3 children by the age of 89.   Patient's last menstrual period was 10/24/2019 (lmp unknown). Last pap n/a.  Review of Systems:   Pertinent items are noted in HPI Denies cramping/contractions, leakage of fluid, vaginal bleeding, abnormal vaginal discharge w/ itching/odor/irritation, headaches, visual changes, shortness of breath, chest pain, abdominal pain, severe nausea/vomiting, or problems with urination or bowel movements unless otherwise stated above.  Pertinent History Reviewed:  Reviewed past medical,surgical, social, obstetrical and family history.  Reviewed problem list, medications and allergies. OB History  Gravida Para Term Preterm AB Living  '2 1 1     1  '$ SAB TAB Ectopic Multiple Live Births        0 1    # Outcome Date GA Lbr Len/2nd Weight Sex Delivery Anes PTL Lv  2 Current           1 Term 09/22/19 438w0d4:50 / 00:32 7 lb 15.5 oz (3.615 kg) F Vag-Spont EPI  LIV     Birth Comments: WDL   Physical Assessment:   Vitals:   01/23/20 1500  BP: 111/70  Pulse: (!) 105  Temp: 98.1 F (36.7 C)  Weight: 199 lb 3.2 oz (90.4 kg)  Body mass index is 33.15 kg/m.       Physical Examination:  General appearance - well appearing, and in no  distress  Mental status - alert, oriented to person, place, and time  Psych:  She has a normal mood and affect  Skin - warm and dry, normal color, no suspicious lesions noted  Chest - effort normal, all lung fields clear to auscultation bilaterally  Heart - normal rate and regular rhythm  Abdomen - soft, nontender  Extremities:  No swelling or varicosities noted  Pelvic - VULVA: normal appearing vulva with no masses, tenderness or lesions  VAGINA: normal appearing vagina with normal color and discharge, no lesions.   CERVIX: normal appearing cervix without discharge or lesions, no CMT    FHTs by doppler: baby A 151 bpm, baby B 161 bpm  Patient informed that the ultrasound is considered a limited OB ultrasound and is not intended to be a complete ultrasound exam.  Patient also informed that the ultrasound is not being completed with the intent of assessing for fetal or placental anomalies or any pelvic abnormalities.  Explained that the purpose of todays ultrasound is to assess for viability.  Babies were found to be in a transverse position and very active. Patient very reassured. Patient acknowledges the purpose of the exam and the limitations of the study.    Assessment & Plan:  1) High-Risk Pregnancy G2P1001 at 137w0dth an Estimated Date of Delivery: 07/30/20   2) Initial OB visit - Welcomed to practice and introduced self to patient in addition to  discussing other advanced practice providers that she may be seeing at this practice - Congratulated patient - Anticipatory guidance on upcoming appointments - Educated on [place] and pregnancy and the integration of virtual appointments  - Educated on babyscripts app- patient reports she has not received email, encouraged to look in spam folder and to call office if she still has not received email - patient verbalizes understanding    3) Supervision of high risk pregnancy, antepartum - CBC/D/Plt+RPR+Rh+ABO+Rub Ab... - Culture, OB Urine -  Cervicovaginal ancillary only( [place]) - Genetic Screening - Protein / creatinine ratio, urine - Comp Met (CMET) - Hemoglobin A1c - [place] MFM OB DETAIL +14 WK, [place] MFM OB DETAIL ADDL GEST +14 WK,  4) Twin gestation in second trimester, unspecified multiple gestation type - [place] MFM OB DETAIL +14 WK, [place] MFM OB DETAIL ADDL GEST +14 WK,   5) Short interval between pregnancies affecting pregnancy in first trimester, antepartum - Planning a Nexplanon or Depo immediately PP  Meds: No orders of the defined types were placed in this encounter.   Initial labs obtained Continue prenatal vitamins Reviewed n/v relief measures and warning s/s to report Reviewed recommended weight gain based on pre-gravid BMI Encouraged well-balanced diet Genetic Screening discussed: ordered Cystic fibrosis, SMA, Fragile X screening discussed ordered The nature of [place] with multiple MDs and other Advanced Practice Providers was explained to patient; also emphasized that residents, students are part of our team.  Discussed optimized OB schedule and video visits. Advised can have an in-office visit whenever she feels she needs to be seen.  Does have own BP cuff. Explained to patient that BP will be mailed to her house. Check BP weekly, let us know if >140/90. Advised to call during normal business hours and there is an after-hours nurse line available.    Follow-up: Return in about 5 weeks (around 02/27/2020) for Return OB visit - transfer care to [facility].   Orders Placed This Encounter  Procedures   Culture, OB Urine   [place] MFM OB DETAIL +14 WK   [place] MFM OB DETAIL ADDL GEST +14 WK   CBC/D/Plt+RPR+Rh+ABO+Rub Ab...   Genetic Screening   Protein / creatinine ratio, urine   Comp Met (CMET)   Hemoglobin A1c    Laury Deep MSN, CNM 01/23/2020

## 2020-01-23 NOTE — Addendum Note (Signed)
Encounter addended by: Vanetta Shawl, RT, RVT, RDMS on: 01/23/2020 4:44 PM  Actions taken: Imaging Exam ended

## 2020-01-24 LAB — CBC/D/PLT+RPR+RH+ABO+RUB AB...
Antibody Screen: NEGATIVE
Basophils Absolute: 0 10*3/uL (ref 0.0–0.2)
Basos: 0 %
EOS (ABSOLUTE): 0 10*3/uL (ref 0.0–0.4)
Eos: 1 %
HCV Ab: <0.1 s/co ratio (ref 0.0–0.9)
HIV Screen 4th Generation wRfx: NONREACTIVE
Hematocrit: 33.6 % — ABNORMAL LOW (ref 34.0–46.6)
Hemoglobin: 10.7 g/dL — ABNORMAL LOW (ref 11.1–15.9)
Hepatitis B Surface Ag: NEGATIVE
Immature Grans (Abs): 0 10*3/uL (ref 0.0–0.1)
Immature Granulocytes: 0 %
Lymphocytes Absolute: 1.4 10*3/uL (ref 0.7–3.1)
Lymphs: 21 %
MCH: 25.4 pg — ABNORMAL LOW (ref 26.6–33.0)
MCHC: 31.8 g/dL (ref 31.5–35.7)
MCV: 80 fL (ref 79–97)
Monocytes Absolute: 0.6 10*3/uL (ref 0.1–0.9)
Monocytes: 9 %
Neutrophils Absolute: 4.7 10*3/uL (ref 1.4–7.0)
Neutrophils: 69 %
Platelets: 184 10*3/uL (ref 150–450)
RBC: 4.21 x10E6/uL (ref 3.77–5.28)
RDW: 14.9 % (ref 11.7–15.4)
RPR Ser Ql: NONREACTIVE
Rh Factor: POSITIVE
Rubella Antibodies, IGG: 3.85 index (ref >0.99)
WBC: 6.8 10*3/uL (ref 3.4–10.8)

## 2020-01-24 LAB — COMPREHENSIVE METABOLIC PANEL
ALT: 6 IU/L (ref 0–32)
AST: 11 IU/L (ref 0–40)
Albumin/Globulin Ratio: 1.7 (ref 1.2–2.2)
Albumin: 4.1 g/dL (ref 3.9–5.0)
Alkaline Phosphatase: 84 IU/L (ref 45–106)
BUN/Creatinine Ratio: 14 (ref 9–23)
BUN: 8 mg/dL (ref 6–20)
Bilirubin Total: <0.2 mg/dL (ref 0.0–1.2)
CO2: 23 mmol/L (ref 20–29)
Calcium: 9.6 mg/dL (ref 8.7–10.2)
Chloride: 104 mmol/L (ref 96–106)
Creatinine, Ser: 0.59 mg/dL (ref 0.57–1.00)
GFR calc Af Amer: 154 mL/min/{1.73_m2} (ref >59)
GFR calc non Af Amer: 133 mL/min/{1.73_m2} (ref >59)
Globulin, Total: 2.4 g/dL (ref 1.5–4.5)
Glucose: 71 mg/dL (ref 65–99)
Potassium: 3.7 mmol/L (ref 3.5–5.2)
Sodium: 140 mmol/L (ref 134–144)
Total Protein: 6.5 g/dL (ref 6.0–8.5)

## 2020-01-24 LAB — CERVICOVAGINAL ANCILLARY ONLY
Bacterial Vaginitis (gardnerella): POSITIVE — AB
Candida Glabrata: NEGATIVE
Candida Vaginitis: NEGATIVE
Chlamydia: NEGATIVE
Comment: NEGATIVE
Comment: NEGATIVE
Comment: NEGATIVE
Comment: NEGATIVE
Comment: NEGATIVE
Comment: NORMAL
Neisseria Gonorrhea: NEGATIVE
Trichomonas: NEGATIVE

## 2020-01-24 LAB — PROTEIN / CREATININE RATIO, URINE
Creatinine, Urine: 301.1 mg/dL
Protein, Ur: 26.6 mg/dL
Protein/Creat Ratio: 88 mg/g creat (ref 0–200)

## 2020-01-24 LAB — HEMOGLOBIN A1C
Est. average glucose Bld gHb Est-mCnc: 103 mg/dL
Hgb A1c MFr Bld: 5.2 % (ref 4.8–5.6)

## 2020-01-24 LAB — HCV INTERPRETATION

## 2020-01-25 LAB — CULTURE, OB URINE

## 2020-01-25 LAB — URINE CULTURE, OB REFLEX

## 2020-01-29 ENCOUNTER — Telehealth: Payer: Self-pay | Admitting: *Deleted

## 2020-01-29 DIAGNOSIS — N760 Acute vaginitis: Secondary | ICD-10-CM

## 2020-01-29 MED ORDER — METRONIDAZOLE 500 MG PO TABS: 500.0000 mg | ORAL_TABLET | Freq: Two times a day (BID) | ORAL | 0 refills | Status: AC

## 2020-01-29 NOTE — Telephone Encounter (Signed)
-----   Message from Raelyn Mora, [place] sent at 01/29/2020  1:30 PM EDT ----- Please treat for BV

## 2020-02-03 ENCOUNTER — Encounter: Payer: Self-pay | Admitting: General Practice

## 2020-02-05 ENCOUNTER — Telehealth: Payer: Self-pay | Admitting: *Deleted

## 2020-02-05 ENCOUNTER — Encounter: Payer: Self-pay | Admitting: General Practice

## 2020-02-05 ENCOUNTER — Encounter: Payer: Self-pay | Admitting: Certified Nurse Midwife

## 2020-02-05 DIAGNOSIS — D563 Thalassemia minor: Secondary | ICD-10-CM | POA: Insufficient documentation

## 2020-02-05 NOTE — Telephone Encounter (Signed)
-----   Message from Donette Larry, [place] sent at 02/05/2020 11:16 AM EDT ----- Regarding: genetic counseling Silent alpha thal carrier found on screen, please arrange genetic counseling.

## 2020-02-27 ENCOUNTER — Ambulatory Visit (INDEPENDENT_AMBULATORY_CARE_PROVIDER_SITE_OTHER): Payer: Medicaid Other | Admitting: Obstetrics and Gynecology

## 2020-02-27 ENCOUNTER — Encounter: Payer: Self-pay | Admitting: Obstetrics and Gynecology

## 2020-02-27 ENCOUNTER — Other Ambulatory Visit: Payer: Self-pay

## 2020-02-27 VITALS — BP 123/63 | HR 78 | Wt 201.0 lb

## 2020-02-27 DIAGNOSIS — O0990 Supervision of high risk pregnancy, unspecified, unspecified trimester: Secondary | ICD-10-CM | POA: Diagnosis not present

## 2020-02-27 DIAGNOSIS — O30042 Twin pregnancy, dichorionic/diamniotic, second trimester: Secondary | ICD-10-CM

## 2020-02-27 DIAGNOSIS — O09899 Supervision of other high risk pregnancies, unspecified trimester: Secondary | ICD-10-CM

## 2020-02-27 DIAGNOSIS — D563 Thalassemia minor: Secondary | ICD-10-CM

## 2020-02-27 MED ORDER — ASPIRIN EC 81 MG PO TBEC
81.0000 mg | DELAYED_RELEASE_TABLET | Freq: Every day | ORAL | 2 refills | Status: DC
Start: 2020-02-27 — End: 2020-06-27

## 2020-02-27 MED ORDER — FOLIC ACID 1 MG PO TABS: 1.0000 mg | ORAL_TABLET | Freq: Every day | ORAL | 2 refills | Status: DC

## 2020-02-27 NOTE — Progress Notes (Signed)
Prenatal Visit Note Date: 02/27/2020 [facility]: [facility]  Transfer of care from [facility] for Di-Di twins  Subjective:  Raven Medina is a 19 y.o. G2P1001 at [redacted]w[redacted]d being seen today for ongoing prenatal care.  She is currently monitored for the following issues for this high-risk pregnancy and has ADHD (attention deficit hyperactivity disorder); Adjustment disorder with anxiety; Acne vulgaris; Supervision of high risk pregnancy, antepartum; Dichorionic diamniotic twin gestation; Alpha thalassemia silent carrier; and Short interval between pregnancies complicating pregnancy, antepartum, unspecified trimester on their problem list.  Patient reports no complaints.   Contractions: Not present. Vag. Bleeding: None.   . Denies leaking of fluid.   The following portions of the patient's history were reviewed and updated as appropriate: allergies, current medications, past family history, past medical history, past social history, past surgical history and problem list. Problem list updated.  Objective:   Vitals:   02/27/20 1542  BP: 123/63  Pulse: 78  Weight: 201 lb (91.2 kg)    Fetal Status:           General:  Alert, oriented and cooperative. Patient is in no acute distress.  Skin: Skin is warm and dry. No rash noted.   Cardiovascular: Normal heart rate noted  Respiratory: Normal respiratory effort, no problems with respiration noted  Abdomen: Soft, gravid, appropriate for gestational age. Pain/Pressure: Present     Pelvic:  Cervical exam deferred        Extremities: Normal range of motion.  Edema: None  Mental Status: Normal mood and affect. Normal behavior. Normal judgment and thought content.   Urinalysis:      Assessment and Plan:  Pregnancy: G2P1001 at [redacted]w[redacted]d  1. Short interval between pregnancies complicating pregnancy, antepartum, unspecified trimester 08/2019 41wk SVD  2. Alpha thalassemia silent carrier D/w her implications and  can    3. Dichorionic diamniotic twin pregnancy in second trimester Follow up anatomy u/s next week Recommend start low dose asa and additional folic acid D/w her re: implications for pregnancy with didi twins and in the setting of short interval prgnancy. D/w her more re: delivery mode later in pregnancy  4. Supervision of high risk pregnancy, antepartum  - AFP, Serum, Open Spina Bifida  Preterm labor symptoms and general obstetric precautions including but not limited to vaginal bleeding, contractions, leaking of fluid and fetal movement were reviewed in detail with the patient. Please refer to After Visit Summary for other counseling recommendations.  Return in about 1 month (around 03/29/2020) for high risk, in person.   Plum Bing, MD

## 2020-02-27 NOTE — Progress Notes (Addendum)
NEW OB packet given  Home Medicaid Form completed  

## 2020-02-29 LAB — AFP, SERUM, OPEN SPINA BIFIDA
AFP MoM: 0.91
AFP Value: 37.3 ng/mL
Gest. Age on Collection Date: 18 weeks
Maternal Age At EDD: 20.3 yr
OSBR Risk 1 IN: 10000
Test Results:: NEGATIVE
Weight: 201 [lb_av]

## 2020-03-04 ENCOUNTER — Encounter: Payer: Self-pay | Admitting: *Deleted

## 2020-03-05 ENCOUNTER — Other Ambulatory Visit: Payer: Medicaid Other

## 2020-03-05 ENCOUNTER — Ambulatory Visit: Payer: Self-pay | Admitting: Genetic Counselor

## 2020-03-05 ENCOUNTER — Ambulatory Visit: Payer: Medicaid Other

## 2020-03-05 ENCOUNTER — Ambulatory Visit: Payer: Medicaid Other | Attending: Obstetrics and Gynecology | Admitting: Genetic Counselor

## 2020-03-05 ENCOUNTER — Other Ambulatory Visit: Payer: Self-pay

## 2020-03-05 DIAGNOSIS — Z315 Encounter for genetic counseling: Secondary | ICD-10-CM

## 2020-03-05 DIAGNOSIS — Z148 Genetic carrier of other disease: Secondary | ICD-10-CM | POA: Diagnosis not present

## 2020-03-05 DIAGNOSIS — D563 Thalassemia minor: Secondary | ICD-10-CM

## 2020-03-05 DIAGNOSIS — Z3A19 19 weeks gestation of pregnancy: Secondary | ICD-10-CM

## 2020-03-05 NOTE — Progress Notes (Signed)
03/05/2020  Raven Medina 05-08-2001 MRN: [id number] DOV: 03/05/2020  Raven Medina presented to the [facility] for a genetics consultation regarding her carrier status for alpha-thalassemia. [name] came to her appointment alone.   Indication for genetic counseling - Silent carrier for alpha-thalassemia  Prenatal history  Raven Medina is a G2P1001, 19 y.o. female. Her current pregnancy has completed [redacted]w[redacted]d(Estimated Date of Delivery: 07/30/20). Ms. SSiehhas a f73old daughter from a prior relationship. This pregnancy is the first with her current partner.  Ms. SHatlestaddenied exposure to environmental toxins or chemical agents. She denied the use of alcohol, tobacco or street drugs. She reported taking prenatal vitamins and aspirin. She denied significant viral illnesses, fevers, and bleeding during the course of her pregnancy. Her medical and surgical histories were noncontributory.  Family History  A three generation pedigree was drafted and reviewed. The family history is remarkable for the following:  - Raven Medina reported a personal history of ADHD. She specifically had trouble concentrating in school. We discussed that many times, ADHD is multifactorial in nature, occurring due to a combination of genetic and environmental factors that are difficult to identify. ADHD can appear to run in families; thus, there is a chance that Raven Medina's children could also experience ADHD. She understands that she should make the pediatrician aware of any concerns she has about her children's development.  The remaining family histories were reviewed and found to be noncontributory for birth defects, intellectual disability, recurrent pregnancy loss, and known genetic conditions. Raven Medina Daughterswas adopted and has limited information about his family history. Thus, risk assessment was limited.  The patient's ethnicity is African  American. The father of the pregnancy's ethnicity is [place]ken[place] Jewish ancestry and consanguinity were denied. Pedigree will be scanned under Media.  Discussion  Alpha-thalassemia:  Ms. SWeyandthad [id number] screening performed through [facility] The results of the screen identified her as a silent carrier for alpha-thalassemia (aa/a-). Alpha-thalassemia is different in its inheritance compared to other hemoglobinopathies as there are two copies of two alpha globin genes (HBA1 and HBA2) on each chromosome 16, or four alpha globin genes total (aa/aa). A person can be a carrier of one alpha gene mutation (aa/a-), also referred to as a "silent carrier". A person who carries two alpha globin gene mutations can either carry them in cis (both on the same chromosome, denoted as aa/--) or in trans (on different chromosomes, denoted as a-/a-).     There are several different forms of alpha-thalassemia. The most severe form of alpha-thalassemia, Hb Barts, is associated with an absence of alpha globin chain synthesis as a result of deletions of all four alpha globin genes (--/--).  Given that [name] is a silent carrier (aa/a-), her pregnancies would not be at increased risk for Hb Barts, even if her partner is a carrier for alpha-thalassemia, as she will always pass on at least one copy of the alpha globin gene to her children. Hemoglobin H (HbH) disease is caused by three deleted or dysfunctioning alpha globin alleles (a-/--) and is characterized by microcytic hypochromic hemolytic anemia, hepatosplenomegaly, mild jaundice, growth retardation, and sometimes thalassemia-like bone changes. Given [name]'s silent carrier status (aa/a-), the current fetus would only be at risk for HbH disease (a-/--), if her partner is a carrier for two alpha globin mutations in cis (aa/--). If this is the case, the risk for HbH disease in the pregnancy would be  1 in 4 (25%). This risk would apply to each of  the current twins as well as all of the couple's future pregnancies. If [name]'s partner is a carrier of alpha-thalassemia in trans or a silent carrier, then their pregnancies would not be at increased risk for HbH disease. Based on the pan-ethnic carrier frequency for alpha-thalassemia, Raven Medina's partner has a 1 in 25 chance of being any type of carrier for alpha-thalassemia. Thus, the couple currently has a ~1 in 100 (1%) chance of having a child with HbH disease.  Raven Medina carrier screening was negative for the other 13 conditions screened. Thus, her risk to be a carrier for these additional conditions (listed separately in the laboratory report) has been reduced but not eliminated. This also significantly reduces her risk of having a child affected by one of these conditions. We discussed that carrier testing for alpha-thalassemia is recommended for [name]'s partner. [name] indicated that she is interested in pursuing partner carrier screening.  Aneuploidy screening:  We also reviewed that [name] had Panorama twin noninvasive prenatal screening (NIPS) through the laboratory [name] that was low-risk for fetal aneuploidies. These results showed a less than 4,000 risk for trisomy 21 and a less than 1 in 10,000 risk for trisomies 18 and 13. We reviewed that while this testing identifies 94-99% of pregnancies with trisomy 73, trisomy 80, and trisomy 13, it is NOT diagnostic. A positive test result requires confirmation by CVS or amniocentesis, and a negative test result does not rule out a fetal chromosome abnormality. She also understands that this testing does not identify all genetic conditions.  Diagnostic testing:  [name] was also counseled regarding diagnostic testing via amniocentesis. We discussed the technical aspects of the procedure and quoted up to a 1 in 500 (0.2%) risk for spontaneous pregnancy loss or other adverse pregnancy outcomes as a result of  amniocentesis. Cultured cells from an amniocentesis sample allow for the visualization of a fetal karyotype, which can detect >99% of chromosomal aberrations. Chromosomal microarray can also be performed to identify smaller deletions or duplications of fetal chromosomal material. Amniocentesis could also be performed to assess whether the babies are affected by alpha-thalassemia. Since the twins are dizygotic and diamniotic, we discussed that a sample would need to be taken from each amniotic sac to ensure that results are accurate for each twin. After careful consideration, Ms. Rounsaville declined amniocentesis at this time. She understands that amniocentesis is available at any point after 16 weeks of pregnancy and that she may opt to undergo the procedure at a later date should she change her mind.  Plan:  Ms. Maddux is interested in pursuing carrier screening for alpha-thalassemia for her partner, Alcide Goodness; however, he does not have health insurance. Based on the couple's household income, Ms. Woznick's partner qualifies for free testing through the laboratory [facility]'s Patient Assistance Program. In order to facilitate free testing, I would need to send a copy of Mr. Earl Many most recent tax return to [facility] for income verification. Ms. Nolton wanted to wait to find these forms before ordering testing. We made a plan for the couple to look for Mr. Earl Many tax forms and contact me once they have found them. I can send him a saliva kit and place the order for testing once I hear from them.   I counseled Ms. Hazell regarding the above risks and available options. The approximate face-to-face time with the genetic counselor was 40 minutes.  In summary:  Discussed  carrier screening results and options for follow-up testing  Silent carrier for alpha-thalassemia  Desires partner carrier screening. Partner qualifies for free testing through [facility]'s Patient Assistance Program. They will  send a copy of his most recent tax form for income verification. I will facilitate testing from there.  Reviewed low-risk NIPS result  Reduction in risk for Down syndrome, trisomy 17, and trisomy 48 for both twins  Offered additional testing and screening  Declined amniocentesis  Reviewed family history concerns   Buelah Manis, MS, Counselling psychologist

## 2020-03-13 ENCOUNTER — Ambulatory Visit: Payer: Medicaid Other | Attending: Obstetrics and Gynecology

## 2020-03-13 ENCOUNTER — Other Ambulatory Visit: Payer: Self-pay

## 2020-03-13 ENCOUNTER — Ambulatory Visit: Payer: Medicaid Other | Admitting: *Deleted

## 2020-03-13 ENCOUNTER — Encounter: Payer: Self-pay | Admitting: *Deleted

## 2020-03-13 DIAGNOSIS — O30002 Twin pregnancy, unspecified number of placenta and unspecified number of amniotic sacs, second trimester: Secondary | ICD-10-CM | POA: Diagnosis not present

## 2020-03-13 DIAGNOSIS — D563 Thalassemia minor: Secondary | ICD-10-CM | POA: Insufficient documentation

## 2020-03-13 DIAGNOSIS — O0990 Supervision of high risk pregnancy, unspecified, unspecified trimester: Secondary | ICD-10-CM

## 2020-03-13 DIAGNOSIS — Z148 Genetic carrier of other disease: Secondary | ICD-10-CM | POA: Diagnosis not present

## 2020-03-13 DIAGNOSIS — O09892 Supervision of other high risk pregnancies, second trimester: Secondary | ICD-10-CM

## 2020-03-13 DIAGNOSIS — Z3A18 18 weeks gestation of pregnancy: Secondary | ICD-10-CM | POA: Diagnosis not present

## 2020-03-13 DIAGNOSIS — Z363 Encounter for antenatal screening for malformations: Secondary | ICD-10-CM | POA: Diagnosis not present

## 2020-03-13 DIAGNOSIS — O30042 Twin pregnancy, dichorionic/diamniotic, second trimester: Secondary | ICD-10-CM | POA: Diagnosis not present

## 2020-03-13 DIAGNOSIS — O99212 Obesity complicating pregnancy, second trimester: Secondary | ICD-10-CM | POA: Diagnosis not present

## 2020-03-13 DIAGNOSIS — E669 Obesity, unspecified: Secondary | ICD-10-CM

## 2020-03-16 ENCOUNTER — Other Ambulatory Visit: Payer: Self-pay | Admitting: *Deleted

## 2020-03-16 DIAGNOSIS — O30042 Twin pregnancy, dichorionic/diamniotic, second trimester: Secondary | ICD-10-CM

## 2020-03-26 ENCOUNTER — Encounter: Payer: Medicaid Other | Admitting: Women's Health

## 2020-04-03 ENCOUNTER — Ambulatory Visit: Payer: Medicaid Other | Admitting: *Deleted

## 2020-04-03 ENCOUNTER — Ambulatory Visit: Payer: Medicaid Other | Attending: Obstetrics and Gynecology

## 2020-04-03 ENCOUNTER — Other Ambulatory Visit: Payer: Self-pay

## 2020-04-03 DIAGNOSIS — D563 Thalassemia minor: Secondary | ICD-10-CM | POA: Diagnosis present

## 2020-04-03 DIAGNOSIS — Z148 Genetic carrier of other disease: Secondary | ICD-10-CM | POA: Diagnosis not present

## 2020-04-03 DIAGNOSIS — O0990 Supervision of high risk pregnancy, unspecified, unspecified trimester: Secondary | ICD-10-CM | POA: Insufficient documentation

## 2020-04-03 DIAGNOSIS — O99212 Obesity complicating pregnancy, second trimester: Secondary | ICD-10-CM

## 2020-04-03 DIAGNOSIS — O30042 Twin pregnancy, dichorionic/diamniotic, second trimester: Secondary | ICD-10-CM

## 2020-04-03 DIAGNOSIS — Z362 Encounter for other antenatal screening follow-up: Secondary | ICD-10-CM

## 2020-04-03 DIAGNOSIS — O09892 Supervision of other high risk pregnancies, second trimester: Secondary | ICD-10-CM | POA: Diagnosis not present

## 2020-04-03 DIAGNOSIS — O322XX0 Maternal care for transverse and oblique lie, not applicable or unspecified: Secondary | ICD-10-CM

## 2020-04-03 DIAGNOSIS — Z3A21 21 weeks gestation of pregnancy: Secondary | ICD-10-CM | POA: Diagnosis not present

## 2020-04-06 ENCOUNTER — Other Ambulatory Visit: Payer: Self-pay | Admitting: *Deleted

## 2020-04-06 ENCOUNTER — Encounter: Payer: Medicaid Other | Admitting: Obstetrics and Gynecology

## 2020-04-06 DIAGNOSIS — O30049 Twin pregnancy, dichorionic/diamniotic, unspecified trimester: Secondary | ICD-10-CM

## 2020-05-01 ENCOUNTER — Ambulatory Visit: Payer: Medicaid Other | Attending: Obstetrics and Gynecology

## 2020-05-01 ENCOUNTER — Ambulatory Visit: Payer: Medicaid Other | Admitting: *Deleted

## 2020-05-01 ENCOUNTER — Other Ambulatory Visit: Payer: Self-pay | Admitting: *Deleted

## 2020-05-01 ENCOUNTER — Other Ambulatory Visit: Payer: Self-pay

## 2020-05-01 DIAGNOSIS — O0990 Supervision of high risk pregnancy, unspecified, unspecified trimester: Secondary | ICD-10-CM

## 2020-05-01 DIAGNOSIS — O30042 Twin pregnancy, dichorionic/diamniotic, second trimester: Secondary | ICD-10-CM

## 2020-05-01 DIAGNOSIS — O99212 Obesity complicating pregnancy, second trimester: Secondary | ICD-10-CM | POA: Diagnosis not present

## 2020-05-01 DIAGNOSIS — D563 Thalassemia minor: Secondary | ICD-10-CM

## 2020-05-01 DIAGNOSIS — E669 Obesity, unspecified: Secondary | ICD-10-CM

## 2020-05-01 DIAGNOSIS — Z3A25 25 weeks gestation of pregnancy: Secondary | ICD-10-CM

## 2020-05-01 DIAGNOSIS — O30049 Twin pregnancy, dichorionic/diamniotic, unspecified trimester: Secondary | ICD-10-CM

## 2020-05-01 DIAGNOSIS — Z148 Genetic carrier of other disease: Secondary | ICD-10-CM | POA: Diagnosis not present

## 2020-05-01 DIAGNOSIS — O09892 Supervision of other high risk pregnancies, second trimester: Secondary | ICD-10-CM | POA: Diagnosis not present

## 2020-05-01 DIAGNOSIS — Z362 Encounter for other antenatal screening follow-up: Secondary | ICD-10-CM

## 2020-05-04 ENCOUNTER — Encounter: Payer: Medicaid Other | Admitting: Obstetrics and Gynecology

## 2020-05-11 ENCOUNTER — Encounter: Payer: Self-pay | Admitting: Genetic Counselor

## 2020-05-18 ENCOUNTER — Other Ambulatory Visit: Payer: Self-pay

## 2020-05-18 ENCOUNTER — Ambulatory Visit (INDEPENDENT_AMBULATORY_CARE_PROVIDER_SITE_OTHER): Payer: Medicaid Other

## 2020-05-18 VITALS — BP 113/65 | HR 104 | Wt 214.4 lb

## 2020-05-18 DIAGNOSIS — O0992 Supervision of high risk pregnancy, unspecified, second trimester: Secondary | ICD-10-CM | POA: Diagnosis not present

## 2020-05-18 DIAGNOSIS — Z3A27 27 weeks gestation of pregnancy: Secondary | ICD-10-CM

## 2020-05-18 DIAGNOSIS — Z23 Encounter for immunization: Secondary | ICD-10-CM

## 2020-05-18 DIAGNOSIS — O0990 Supervision of high risk pregnancy, unspecified, unspecified trimester: Secondary | ICD-10-CM | POA: Diagnosis not present

## 2020-05-18 DIAGNOSIS — O99012 Anemia complicating pregnancy, second trimester: Secondary | ICD-10-CM

## 2020-05-18 DIAGNOSIS — O30042 Twin pregnancy, dichorionic/diamniotic, second trimester: Secondary | ICD-10-CM

## 2020-05-18 NOTE — Progress Notes (Signed)
HIGH-RISK PREGNANCY OFFICE VISIT  Patient name: Raven Medina MRN [id number]  Date of birth: 2000/08/11 Chief Complaint:   Routine Prenatal Visit  Subjective:   Raven Medina is a 19 y.o. G22P1001 female at [redacted]w[redacted]d with an Estimated Date of Delivery: 08/11/20 being seen today for ongoing management of a high-risk pregnancy aeb has ADHD (attention deficit hyperactivity disorder); Adjustment disorder with anxiety; Acne vulgaris; Supervision of high risk pregnancy, antepartum; Dichorionic diamniotic twin gestation; Alpha thalassemia silent carrier; and Short interval between pregnancies complicating pregnancy, antepartum, unspecified trimester on their problem list.  Patient presents today without complaints, questions, or concerns. Patient endorses fetal movement x 2.  She denies vaginal concerns including abnormal discharge, leaking of fluid, and bleeding. She reports increased vaginal discharge, but denies cramping, contractions, and odor, itching, burning, or other irritation.   Contractions: Not present. Vag. Bleeding: None.  Movement: Present.  Reviewed past medical,surgical, social, obstetrical and family history as well as problem list, medications and allergies.  Objective   Vitals:   05/18/20 1021  BP: 113/65  Pulse: (!) 104  Weight: 214 lb 6.4 oz (97.3 kg)  Body mass index is 35.68 kg/m.  Total Weight Gain:15 lb 6.4 oz (6.985 kg)         Physical Examination:   General appearance: Well appearing, and in no distress  Mental status: Alert, oriented to person, place, and time  Skin: Warm & dry  Cardiovascular: Normal heart rate noted  Respiratory: Normal respiratory effort, no distress  Abdomen: Soft, gravid, nontender, AGA with Fundal height of Fundal Height: 29 cm  Pelvic: Cervical exam deferred           Extremities: Edema: None  Fetal Status: Fetal Heart Rate (bpm): 140/150  Movement: Present   No results found for this or any previous visit (from the past 24  hour(s)).  Assessment & Plan:  HIGH-risk pregnancy of a 19 y.o., G2P1001 at [redacted]w[redacted]d with an Estimated Date of Delivery: 08/11/20   1. Supervision of high risk pregnancy, antepartum -Not taking prenatal vitamins.  States "I just got tired of taking and having to remember to take everyday."  -Encouraged usage of prenatal vitamins.  -Informed of normalcy of vaginal discharge during pregnancy.  -Reviewed symptoms of worsening of condition I.e. vaginal irritation, odor, or color change.   2. [redacted] weeks gestation of pregnancy -Doing well. -One hour glucola completed today. -Patient endorses access to Mychart.  -Reviewed blood draw procedures and labs which also include check of iron level.  -Discussed how results of one hour GTT are handled including diabetic education and BS testing for abnormal results and routine care for normal results.   3. Dichorionic diamniotic twin pregnancy in second trimester -Scheduled for growth [place] on 11/8. -Patient aware.   Meds: No orders of the defined types were placed in this encounter.  Labs/procedures today:   Lab Orders     Glucose, 1 hour gestational     RPR     CBC     HIV antibody (with reflex)   Reviewed: Preterm labor symptoms and general obstetric precautions including but not limited to vaginal bleeding, contractions, leaking of fluid and fetal movement were reviewed in detail with the patient.  All questions were answered.  Follow-up: Return in about 2 weeks (around 06/01/2020) for HROB.  Orders Placed This Encounter  Procedures  . Tdap vaccine greater than or equal to 7yo IM  . Glucose, 1 hour gestational  . RPR  . CBC  . HIV antibody (with  reflex)   Cherre Robins MSN, CNM 05/18/2020

## 2020-05-19 LAB — CBC
Hematocrit: 25.3 % — ABNORMAL LOW (ref 34.0–46.6)
Hemoglobin: 8 g/dL — ABNORMAL LOW (ref 11.1–15.9)
MCH: 23.1 pg — ABNORMAL LOW (ref 26.6–33.0)
MCHC: 31.6 g/dL (ref 31.5–35.7)
MCV: 73 fL — ABNORMAL LOW (ref 79–97)
Platelets: 215 10*3/uL (ref 150–450)
RBC: 3.46 x10E6/uL — ABNORMAL LOW (ref 3.77–5.28)
RDW: 14.7 % (ref 11.7–15.4)
WBC: 7.3 10*3/uL (ref 3.4–10.8)

## 2020-05-19 LAB — RPR: RPR Ser Ql: NONREACTIVE

## 2020-05-19 LAB — GLUCOSE, 1 HOUR GESTATIONAL: Gestational Diabetes Screen: 97 mg/dL (ref 65–139)

## 2020-05-19 LAB — HIV ANTIBODY (ROUTINE TESTING W REFLEX): HIV Screen 4th Generation wRfx: NONREACTIVE

## 2020-05-19 MED ORDER — FERROUS SULFATE 325 (65 FE) MG PO TABS: 325.0000 mg | ORAL_TABLET | ORAL | 1 refills | Status: DC

## 2020-05-19 NOTE — Addendum Note (Signed)
Addended by: Gerrit Heck L on: 05/19/2020 11:26 AM   Modules accepted: Orders

## 2020-05-25 ENCOUNTER — Inpatient Hospital Stay (HOSPITAL_COMMUNITY)
Admission: AD | Admit: 2020-05-25 | Discharge: 2020-05-25 | Disposition: A | Discharge status: Home / Self Care | Payer: Medicaid Other | Attending: Family Medicine | Admitting: Family Medicine

## 2020-05-25 ENCOUNTER — Encounter (HOSPITAL_COMMUNITY): Payer: Self-pay | Admitting: Family Medicine

## 2020-05-25 ENCOUNTER — Other Ambulatory Visit: Payer: Self-pay

## 2020-05-25 DIAGNOSIS — O30043 Twin pregnancy, dichorionic/diamniotic, third trimester: Secondary | ICD-10-CM | POA: Diagnosis not present

## 2020-05-25 DIAGNOSIS — Z87891 Personal history of nicotine dependence: Secondary | ICD-10-CM | POA: Insufficient documentation

## 2020-05-25 DIAGNOSIS — O26893 Other specified pregnancy related conditions, third trimester: Secondary | ICD-10-CM | POA: Diagnosis not present

## 2020-05-25 DIAGNOSIS — R102 Pelvic and perineal pain: Secondary | ICD-10-CM

## 2020-05-25 DIAGNOSIS — Z3A28 28 weeks gestation of pregnancy: Secondary | ICD-10-CM | POA: Diagnosis not present

## 2020-05-25 LAB — URINALYSIS, ROUTINE W REFLEX MICROSCOPIC
Bilirubin Urine: NEGATIVE
Glucose, UA: NEGATIVE mg/dL
Hgb urine dipstick: NEGATIVE
Ketones, ur: NEGATIVE mg/dL
Nitrite: NEGATIVE
Protein, ur: 30 mg/dL — AB
Specific Gravity, Urine: 1.023 (ref 1.005–1.030)
pH: 7 (ref 5.0–8.0)

## 2020-05-25 LAB — WET PREP, GENITAL
Sperm: NONE SEEN
Trich, Wet Prep: NONE SEEN
Yeast Wet Prep HPF POC: NONE SEEN

## 2020-05-25 MED ORDER — COMFORT FIT MATERNITY SUPP SM MISC: 1.0000 [IU] | Freq: Every day | 0 refills | Status: DC | PRN

## 2020-05-25 NOTE — Discharge Instructions (Signed)
Recommend pregnancy support belt/maternity support belt.  One brand is called Prenatal Cradle You may find these on Amazon.com, [employer].com, or [employer].com       Fetal Movement Counts Patient Name: ________________________________________________ Patient Due Date: ____________________ What is a fetal movement count?  A fetal movement count is the number of times that you feel your baby move during a certain amount of time. This may also be called a fetal kick count. A fetal movement count is recommended for every pregnant woman. You may be asked to start counting fetal movements as early as week 28 of your pregnancy. Pay attention to when your baby is most active. You may notice your baby's sleep and wake cycles. You may also notice things that make your baby move more. You should do a fetal movement count:  When your baby is normally most active.  At the same time each day. A good time to count movements is while you are resting, after having something to eat and drink. How do I count fetal movements? 1. Find a quiet, comfortable area. Sit, or lie down on your side. 2. Write down the date, the start time and stop time, and the number of movements that you felt between those two times. Take this information with you to your health care visits. 3. Write down your start time when you feel the first movement. 4. Count kicks, flutters, swishes, rolls, and jabs. You should feel at least 10 movements. 5. You may stop counting after you have felt 10 movements, or if you have been counting for 2 hours. Write down the stop time. 6. If you do not feel 10 movements in 2 hours, contact your health care provider for further instructions. Your health care provider may want to do additional tests to assess your baby's well-being. Contact a health care provider if:  You feel fewer than 10 movements in 2 hours.  Your baby is not moving like he or she usually does. Date: ____________ Start time:  ____________ Stop time: ____________ Movements: ____________ Date: ____________ Start time: ____________ Stop time: ____________ Movements: ____________ Date: ____________ Start time: ____________ Stop time: ____________ Movements: ____________ Date: ____________ Start time: ____________ Stop time: ____________ Movements: ____________ Date: ____________ Start time: ____________ Stop time: ____________ Movements: ____________ Date: ____________ Start time: ____________ Stop time: ____________ Movements: ____________ Date: ____________ Start time: ____________ Stop time: ____________ Movements: ____________ Date: ____________ Start time: ____________ Stop time: ____________ Movements: ____________ Date: ____________ Start time: ____________ Stop time: ____________ Movements: ____________ This information is not intended to replace advice given to you by your health care provider. Make sure you discuss any questions you have with your health care provider. Document Revised: 02/28/2019 Document Reviewed: 02/28/2019 Elsevier Patient Education  2020 [employer].

## 2020-05-25 NOTE — MAU Provider Note (Signed)
History     CSN: [id number]  Arrival date and time: 05/25/20 1707   First Provider Initiated Contact with Patient 05/25/20 1823      Chief Complaint  Patient presents with  . Abdominal Pain    Pressure in vaginal area    HPI Raven Medina is a 19 y.o. G2P1001 at [redacted]w[redacted]d with di/di twins who presents with vaginal pressure. Symptoms started about 2 weeks ago & has gradually worsened. Reports intermittent vaginal pressure that is worse with walking, position changes, and prolonged sitting. Has not treated symptoms. Denies contractions, LOF, dysuria, or vaginal bleeding. Good fetal movement x 2.   Location: pelvis Quality: pressure Severity: 3/10 on pain scale Duration: 2 weeks Timing: comes & goes Modifying factors: worse with walking, prolonged sitting, position changes Associated signs and symptoms: none  OB History    Gravida  2   Para  1   Term  1   Preterm      AB      Living  1     SAB      TAB      Ectopic      Multiple  0   Live Births  1           Past Medical History:  Diagnosis Date  . ADHD (attention deficit hyperactivity disorder)   . Allergy    seasonal  . Asthma    intermittent  . Iron deficiency anemia 04/17/2014  . Obesity   . Pregnancy induced hypertension   . Supervision of normal first pregnancy, antepartum 02/19/2019    Nursing Staff Provider Office Location [facility] Dating  LMP Language  English Anatomy US  Normal - incomplete - FU > Normal  Flu Vaccine  Declined 06/27/2019 Genetic Screen  NIPS: LR girl   AFP:  TDaP vaccine   Declined 06/27/2019 Hgb A1C or  GTT Early 5.4 Third trimester nmL 75-117-91 Rhogam  NA   LAB RESULTS  Feeding Plan Pumping Breast/bottle Blood Type O/Positive/-- (08/05 1541)  Contracepti    Past Surgical History:  Procedure Laterality Date  . HERNIA REPAIR    . myringotomy with tubes Bilateral    at age 3  . WISDOM TOOTH EXTRACTION      Family History  Problem Relation Age of Onset  . Hypertension  Mother   . Hypertension Maternal Grandmother   . Hypertension Maternal Grandfather     Social History   Tobacco Use  . Smoking status: Former Smoker    Types: Cigars  . Smokeless tobacco: Never Used  . Tobacco comment: mother smokes inside home  Vaping Use  . Vaping Use: Never used  Substance Use Topics  . Alcohol use: Not Currently    Alcohol/week: 0.0 standard drinks    Comment: Social when not pregnant  . Drug use: Not Currently    Types: Marijuana    Comment: Last smoked 01/08/19    Allergies: No Known Allergies  Medications Prior to Admission  Medication Sig Dispense Refill Last Dose  . aspirin EC 81 MG tablet Take 1 tablet (81 mg total) by mouth daily. (Patient not taking: Reported on 05/18/2020) 60 tablet 2   . ferrous sulfate (FERROUSUL) 325 (65 FE) MG tablet Take 1 tablet (325 mg total) by mouth every other day. 60 tablet 1   . folic acid (FOLVITE) 1 MG tablet Take 1 tablet (1 mg total) by mouth daily. (Patient not taking: Reported on 04/03/2020) 60 tablet 2   . Misc. Devices (GOJJI WEIGHT SCALE)  MISC 1 Device by Does not apply route daily as needed. To weight self daily as needed at home. ICD-10 code: O43.90 (Patient not taking: Reported on 05/18/2020) 1 each 0   . Prenatal Vit-Fe Fumarate-FA (PRENATAL PLUS/IRON) 27-1 MG TABS Take 1 tablet by mouth daily. (Patient not taking: Reported on 05/18/2020) 30 tablet 12     Review of Systems  Constitutional: Negative.   Gastrointestinal: Negative.   Genitourinary: Positive for pelvic pain. Negative for dysuria and vaginal bleeding.  Musculoskeletal: Negative.    Physical Exam   Blood pressure 119/74, pulse 89, height 5\' 5"  (1.651 m), weight 97.1 kg, last menstrual period 10/24/2019, SpO2 100 %, not currently breastfeeding.  Physical Exam Vitals and nursing note reviewed. Exam conducted with a chaperone present.  Constitutional:      General: She is not in acute distress.    Appearance: She is well-developed.   Pulmonary:     Effort: Pulmonary effort is normal. No respiratory distress.  Abdominal:     Palpations: Abdomen is soft.  Genitourinary:    Comments: Dilation: Closed Effacement (%): Thick Cervical Position: Posterior Station: -3 Exam by:: [email or web address] NP  Neurological:     Mental Status: She is alert.    Fetal Tracing: Baby A Baseline: 145 Variability: moderate Accelerations: 15x15 Decelerations: none  Baby B Baseline: 135 Variability: moderate Accelerations: 10x10 Decelerations: variable  Toco: none  MAU Course  Procedures Results for orders placed or performed during the hospital encounter of 05/25/20 (from the past 24 hour(s))  Urinalysis, Routine w reflex microscopic Urine, Clean Catch     Status: Abnormal   Collection Time: 05/25/20  5:44 PM  Result Value Ref Range   Color, Urine YELLOW YELLOW   APPearance CLOUDY (A) CLEAR   Specific Gravity, Urine 1.023 1.005 - 1.030   pH 7.0 5.0 - 8.0   Glucose, UA NEGATIVE NEGATIVE mg/dL   Hgb urine dipstick NEGATIVE NEGATIVE   Bilirubin Urine NEGATIVE NEGATIVE   Ketones, ur NEGATIVE NEGATIVE mg/dL   Protein, ur 30 (A) NEGATIVE mg/dL   Nitrite NEGATIVE NEGATIVE   Leukocytes,Ua MODERATE (A) NEGATIVE   RBC / HPF 0-5 0 - 5 RBC/hpf   WBC, UA 0-5 0 - 5 WBC/hpf   Bacteria, UA FEW (A) NONE SEEN   Squamous Epithelial / LPF 21-50 0 - 5   Mucus PRESENT   Wet prep, genital     Status: Abnormal   Collection Time: 05/25/20  6:32 PM   Specimen: PATH Cytology Cervicovaginal Ancillary Only  Result Value Ref Range   Yeast Wet Prep HPF POC NONE SEEN NONE SEEN   Trich, Wet Prep NONE SEEN NONE SEEN   Clue Cells Wet Prep HPF POC PRESENT (A) NONE SEEN   WBC, Wet Prep HPF POC MANY (A) NONE SEEN   Sperm NONE SEEN     MDM No contractions on monitor. Cervix closed/thick. U/a with moderate leuks; no urinary complaints; will send urine for culture.  Patient had term delivery in February of this year & is now [redacted] weeks pregnant with  twins. Discussed round ligament pian & pelvic pain that is expected at this gestational age with multiples. Will give rx for maternity support belt. If symptoms not improved by next visit, may consider PT.   Baby B is very active. Much difficulty getting continuous tracing of both babies, especially baby B. Used BSUS to assess fetal positioning to assist with monitoring but still unable to get consistent tracing. Reviewed tracing with Dr. 26. Tracing that  we do have is reassuring & patient reports good fetal movement. Will discharge home.   Assessment and Plan   1. Pelvic pressure in pregnancy, antepartum, third trimester   2. Dichorionic diamniotic twin pregnancy in third trimester   3. [redacted] weeks gestation of pregnancy    -Rx maternity support belt & given info for [employer] for fitting -Reviewed PTL precautions & reasons to return to MAU -GC/CT & urine culture pending  Judeth Horn 05/25/2020, 6:23 PM

## 2020-05-25 NOTE — MAU Note (Signed)
Patient c/o intermittent  pressure in her vaginal area that has been slowly worsening over the past few weeks. Patient denies ROM, VB, endorses +Fm .

## 2020-05-26 LAB — CULTURE, OB URINE

## 2020-05-26 LAB — GC/CHLAMYDIA PROBE AMP (~~LOC~~) NOT AT ARMC
Chlamydia: NEGATIVE
Comment: NEGATIVE
Comment: NORMAL
Neisseria Gonorrhea: NEGATIVE

## 2020-06-01 ENCOUNTER — Encounter: Payer: Medicaid Other | Admitting: Family Medicine

## 2020-06-01 ENCOUNTER — Encounter: Payer: Self-pay | Admitting: *Deleted

## 2020-06-01 ENCOUNTER — Ambulatory Visit: Payer: Medicaid Other | Attending: Obstetrics and Gynecology

## 2020-06-01 ENCOUNTER — Other Ambulatory Visit: Payer: Self-pay | Admitting: *Deleted

## 2020-06-01 ENCOUNTER — Ambulatory Visit: Payer: Medicaid Other | Admitting: *Deleted

## 2020-06-01 ENCOUNTER — Other Ambulatory Visit: Payer: Self-pay

## 2020-06-01 DIAGNOSIS — O30043 Twin pregnancy, dichorionic/diamniotic, third trimester: Secondary | ICD-10-CM

## 2020-06-01 DIAGNOSIS — D563 Thalassemia minor: Secondary | ICD-10-CM

## 2020-06-01 DIAGNOSIS — O0990 Supervision of high risk pregnancy, unspecified, unspecified trimester: Secondary | ICD-10-CM | POA: Insufficient documentation

## 2020-06-01 DIAGNOSIS — O30049 Twin pregnancy, dichorionic/diamniotic, unspecified trimester: Secondary | ICD-10-CM | POA: Diagnosis present

## 2020-06-01 DIAGNOSIS — Z3A29 29 weeks gestation of pregnancy: Secondary | ICD-10-CM | POA: Diagnosis not present

## 2020-06-01 DIAGNOSIS — Z362 Encounter for other antenatal screening follow-up: Secondary | ICD-10-CM | POA: Diagnosis not present

## 2020-06-05 ENCOUNTER — Encounter: Payer: Medicaid Other | Admitting: Obstetrics & Gynecology
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
## 2020-06-27 ENCOUNTER — Inpatient Hospital Stay (HOSPITAL_COMMUNITY)
Admission: AD | Admit: 2020-06-27 | Discharge: 2020-06-27 | Disposition: A | Discharge status: Home / Self Care | Payer: Medicaid Other | Attending: Obstetrics and Gynecology | Admitting: Obstetrics and Gynecology

## 2020-06-27 ENCOUNTER — Encounter (HOSPITAL_COMMUNITY): Payer: Self-pay | Admitting: Obstetrics and Gynecology

## 2020-06-27 ENCOUNTER — Other Ambulatory Visit: Payer: Self-pay

## 2020-06-27 DIAGNOSIS — O30043 Twin pregnancy, dichorionic/diamniotic, third trimester: Secondary | ICD-10-CM | POA: Diagnosis not present

## 2020-06-27 DIAGNOSIS — K219 Gastro-esophageal reflux disease without esophagitis: Secondary | ICD-10-CM | POA: Diagnosis not present

## 2020-06-27 DIAGNOSIS — Z3A33 33 weeks gestation of pregnancy: Secondary | ICD-10-CM | POA: Diagnosis not present

## 2020-06-27 DIAGNOSIS — K5900 Constipation, unspecified: Secondary | ICD-10-CM | POA: Insufficient documentation

## 2020-06-27 DIAGNOSIS — O99613 Diseases of the digestive system complicating pregnancy, third trimester: Secondary | ICD-10-CM | POA: Diagnosis not present

## 2020-06-27 DIAGNOSIS — Z3689 Encounter for other specified antenatal screening: Secondary | ICD-10-CM

## 2020-06-27 DIAGNOSIS — Z7982 Long term (current) use of aspirin: Secondary | ICD-10-CM | POA: Insufficient documentation

## 2020-06-27 DIAGNOSIS — R110 Nausea: Secondary | ICD-10-CM

## 2020-06-27 DIAGNOSIS — O99891 Other specified diseases and conditions complicating pregnancy: Secondary | ICD-10-CM | POA: Diagnosis not present

## 2020-06-27 DIAGNOSIS — Z87891 Personal history of nicotine dependence: Secondary | ICD-10-CM | POA: Insufficient documentation

## 2020-06-27 MED ORDER — ONDANSETRON 4 MG PO TBDP
8.0000 mg | ORAL_TABLET | Freq: Once | ORAL | Status: AC
  Administered 2020-06-27: 8 mg via ORAL
  Filled 2020-06-27: qty 2

## 2020-06-27 MED ORDER — FLEET ENEMA 7-19 GM/118ML RE ENEM
1.0000 | ENEMA | Freq: Once | RECTAL | Status: AC
  Administered 2020-06-27: 1 via RECTAL

## 2020-06-27 NOTE — MAU Provider Note (Signed)
History     CSN: [id number]  Arrival date and time: 06/27/20 1906   First Provider Initiated Contact with Patient 06/27/20 2115      No chief complaint on file.  Raven Medina is a 19 y.o. G2P1001 at [redacted]w[redacted]d who receives care at [facility].  She presents today for constipation.  She states she has not had a bowel movement in so long she can't remember.  She states she was able to "force a really small turd out" around 1500.  She states because of her constipation she has been experiencing abdominal tightening that is intermittent, but was initially constant until she threw up around 0700.  She states the pain was present upon waking around 10am.  She states she has not taken anything for the pain.  She also states she feels like "something is stuck up" her rectum.  She endorses fetal movement x2 and denies vaginal discharge or bleeding.     OB History    Gravida  2   Para  1   Term  1   Preterm      AB      Living  1     SAB      TAB      Ectopic      Multiple  0   Live Births  1           Past Medical History:  Diagnosis Date  . ADHD (attention deficit hyperactivity disorder)   . Allergy    seasonal  . Asthma    intermittent  . Iron deficiency anemia 04/17/2014  . Obesity   . Pregnancy induced hypertension   . Supervision of normal first pregnancy, antepartum 02/19/2019    Nursing Staff Provider Office Location [facility] Dating  LMP Language  English Anatomy US  Normal - incomplete - FU > Normal  Flu Vaccine  Declined 06/27/2019 Genetic Screen  NIPS: LR girl   AFP:  TDaP vaccine   Declined 06/27/2019 Hgb A1C or  GTT Early 5.4 Third trimester nmL 75-117-91 Rhogam  NA   LAB RESULTS  Feeding Plan Pumping Breast/bottle Blood Type O/Positive/-- (08/05 1541)  Contracepti    Past Surgical History:  Procedure Laterality Date  . HERNIA REPAIR    . myringotomy with tubes Bilateral    at age 72  . WISDOM TOOTH EXTRACTION      Family History  Problem Relation Age of  Onset  . Hypertension Mother   . Hypertension Maternal Grandmother   . Hypertension Maternal Grandfather     Social History   Tobacco Use  . Smoking status: Former Smoker    Types: Cigars  . Smokeless tobacco: Never Used  . Tobacco comment: mother smokes inside home  Vaping Use  . Vaping Use: Never used  Substance Use Topics  . Alcohol use: Not Currently    Alcohol/week: 0.0 standard drinks    Comment: Social when not pregnant  . Drug use: Not Currently    Types: Marijuana    Comment: Last smoked 01/08/19    Allergies: No Known Allergies  Medications Prior to Admission  Medication Sig Dispense Refill Last Dose  . aspirin EC 81 MG tablet Take 1 tablet (81 mg total) by mouth daily. (Patient not taking: Reported on 05/18/2020) 60 tablet 2   . Elastic Bandages & Supports (COMFORT FIT MATERNITY SUPP SM) MISC 1 Units by Does not apply route daily as needed. 1 each 0   . ferrous sulfate (FERROUSUL) 325 (65 FE) MG tablet  Take 1 tablet (325 mg total) by mouth every other day. (Patient not taking: Reported on 06/01/2020) 60 tablet 1   . folic acid (FOLVITE) 1 MG tablet Take 1 tablet (1 mg total) by mouth daily. (Patient not taking: Reported on 04/03/2020) 60 tablet 2   . Misc. Devices (GOJJI WEIGHT SCALE) MISC 1 Device by Does not apply route daily as needed. To weight self daily as needed at home. ICD-10 code: O68.90 (Patient not taking: Reported on 05/18/2020) 1 each 0   . Prenatal Vit-Fe Fumarate-FA (PRENATAL PLUS/IRON) 27-1 MG TABS Take 1 tablet by mouth daily. (Patient not taking: Reported on 05/18/2020) 30 tablet 12     Review of Systems  Constitutional: Negative for chills and fever.  Respiratory: Negative for cough and shortness of breath.   Gastrointestinal: Positive for abdominal pain, constipation, nausea and vomiting. Negative for diarrhea.  Genitourinary: Negative for difficulty urinating, dysuria, vaginal bleeding and vaginal discharge.  Neurological: Negative for dizziness,  light-headedness and headaches.   Physical Exam   Blood pressure 123/75, pulse 88, temperature 98.8 F (37.1 C), temperature source Oral, resp. rate 17, height 5\' 5"  (1.651 m), weight 102.2 kg, last menstrual period 10/24/2019, SpO2 100 %, not currently breastfeeding.  Physical Exam Constitutional:      Appearance: Normal appearance.  HENT:     Head: Normocephalic and atraumatic.  Eyes:     Conjunctiva/sclera: Conjunctivae normal.  Cardiovascular:     Rate and Rhythm: Regular rhythm.     Heart sounds: Normal heart sounds.  Pulmonary:     Effort: Pulmonary effort is normal. No respiratory distress.     Breath sounds: Normal breath sounds.  Abdominal:     Palpations: Abdomen is soft.     Tenderness: There is no abdominal tenderness.     Comments: Gravid, Appears LGA  Musculoskeletal:        General: Normal range of motion.     Cervical back: Normal range of motion.  Skin:    General: Skin is warm and dry.  Neurological:     Mental Status: She is alert and oriented to person, place, and time.  Psychiatric:        Mood and Affect: Mood normal.        Behavior: Behavior normal.        Thought Content: Thought content normal.     Fetal Assessment 135 bpm, Mod Var, -Decels, +Accels 120 bpm, Mod Var, -Decels, +Accels Toco: Occasional Irregular  MAU Course  No results found for this or any previous visit (from the past 24 hour(s)). No results found.  MDM PE Labs: None EFM Enema AntiEmetic Assessment and Plan  19 year old G2P1001  TIUP at 32.4weeks Cat I FT x 2 Constipation Nausea  -POC Reviewed. -Patient informed of enema; how it is administered, what it does, and anticipated reaction time. -Informed that symptoms are likely related to constipation, but if unrelieved with initial intervention will plan for PTL work-up.  -Exam performed -NST Reactive -Fleets enema ordered -Zofran 8mg  x 1 for nausea.  32 MSN, CNM 06/27/2020, 9:15 PM    Reassessment (10:35 PM)  -Provider to bedside and patient sitting up with coat on.  States she feels better and is ready for discharge. -Patient reports some "sour stomach" and questions what it is. -Reviewed GERD/Indigestion and treatment.  Patient instructed to start with Tums as she expresses preference of avoiding medication. -Patient instructed to take colace and/or miralax for constipation at home.  States she has  lactulose.  Informed that this should be fine, but if no results after 2 doses discontinue use. -Given pregnancy safe medication sheet. -Encouraged to call or return to MAU if symptoms worsen or with the onset of new symptoms. -Discharged to home in improved condition.  Cherre Robins MSN, CNM Advanced Practice Provider, [facility]

## 2020-06-27 NOTE — MAU Note (Signed)
..  Raven Medina is a 19 y.o. at [redacted]w[redacted]d here in MAU reporting: constipation that were causing her to have lower abdominal pain, denies pain now. Reports one episode of vomiting. +FM. Denies vaginal bleeding or LOF  Pain score: 0/10 Vitals:   06/27/20 1939  BP: 123/75  Pulse: 88  Resp: 17  Temp: 98.8 F (37.1 C)  SpO2: 100%      Lab orders placed from triage: UA

## 2020-06-27 NOTE — Discharge Instructions (Signed)
Gastroesophageal Reflux Disease, Adult Gastroesophageal reflux (GER) happens when acid from the stomach flows up into the tube that connects the mouth and the stomach (esophagus). Normally, food travels down the esophagus and stays in the stomach to be digested. However, when a person has GER, food and stomach acid sometimes move back up into the esophagus. If this becomes a more serious problem, the person may be diagnosed with a disease called gastroesophageal reflux disease (GERD). GERD occurs when the reflux:  Happens often.  Causes frequent or severe symptoms.  Causes problems such as damage to the esophagus. When stomach acid comes in contact with the esophagus, the acid may cause soreness (inflammation) in the esophagus. Over time, GERD may create small holes (ulcers) in the lining of the esophagus. What are the causes? This condition is caused by a problem with the muscle between the esophagus and the stomach (lower esophageal sphincter, or LES). Normally, the LES muscle closes after food passes through the esophagus to the stomach. When the LES is weakened or abnormal, it does not close properly, and that allows food and stomach acid to go back up into the esophagus. The LES can be weakened by certain dietary substances, medicines, and medical conditions, including:  Tobacco use.  Pregnancy.  Having a hiatal hernia.  Alcohol use.  Certain foods and beverages, such as coffee, chocolate, onions, and peppermint. What increases the risk? You are more likely to develop this condition if you:  Have an increased body weight.  Have a connective tissue disorder.  Use NSAID medicines. What are the signs or symptoms? Symptoms of this condition include:  Heartburn.  Difficult or painful swallowing.  The feeling of having a lump in the throat.  Abitter taste in the mouth.  Bad breath.  Having a large amount of saliva.  Having an upset or bloated  stomach.  Belching.  Chest pain. Different conditions can cause chest pain. Make sure you see your health care provider if you experience chest pain.  Shortness of breath or wheezing.  Ongoing (chronic) cough or a night-time cough.  Wearing away of tooth enamel.  Weight loss. How is this diagnosed? Your health care provider will take a medical history and perform a physical exam. To determine if you have mild or severe GERD, your health care provider may also monitor how you respond to treatment. You may also have tests, including:  A test to examine your stomach and esophagus with a small camera (endoscopy).  A test thatmeasures the acidity level in your esophagus.  A test thatmeasures how much pressure is on your esophagus.  A barium swallow or modified barium swallow test to show the shape, size, and functioning of your esophagus. How is this treated? The goal of treatment is to help relieve your symptoms and to prevent complications. Treatment for this condition may vary depending on how severe your symptoms are. Your health care provider may recommend:  Changes to your diet.  Medicine.  Surgery. Follow these instructions at home: Eating and drinking   Follow a diet as recommended by your health care provider. This may involve avoiding foods and drinks such as: ? Coffee and tea (with or without caffeine). ? Drinks that containalcohol. ? Energy drinks and sports drinks. ? Carbonated drinks or sodas. ? Chocolate and cocoa. ? Peppermint and mint flavorings. ? Garlic and onions. ? Horseradish. ? Spicy and acidic foods, including peppers, chili powder, curry powder, vinegar, hot sauces, and barbecue sauce. ? Citrus fruit juices and citrus  fruits, such as oranges, lemons, and limes. ? Tomato-based foods, such as red sauce, chili, salsa, and pizza with red sauce. ? Fried and fatty foods, such as donuts, french fries, potato chips, and high-fat dressings. ? High-fat  meats, such as hot dogs and fatty cuts of red and white meats, such as rib eye steak, sausage, ham, and bacon. ? High-fat dairy items, such as whole milk, butter, and cream cheese.  Eat small, frequent meals instead of large meals.  Avoid drinking large amounts of liquid with your meals.  Avoid eating meals during the 2-3 hours before bedtime.  Avoid lying down right after you eat.  Do not exercise right after you eat. Lifestyle   Do not use any products that contain nicotine or tobacco, such as cigarettes, e-cigarettes, and chewing tobacco. If you need help quitting, ask your health care provider.  Try to reduce your stress by using methods such as yoga or meditation. If you need help reducing stress, ask your health care provider.  If you are overweight, reduce your weight to an amount that is healthy for you. Ask your health care provider for guidance about a safe weight loss goal. General instructions  Pay attention to any changes in your symptoms.  Take over-the-counter and prescription medicines only as told by your health care provider. Do not take aspirin, ibuprofen, or other NSAIDs unless your health care provider told you to do so.  Wear loose-fitting clothing. Do not wear anything tight around your waist that causes pressure on your abdomen.  Raise (elevate) the head of your bed about 6 inches (15 cm).  Avoid bending over if this makes your symptoms worse.  Keep all follow-up visits as told by your health care provider. This is important. Contact a health care provider if:  You have: ? New symptoms. ? Unexplained weight loss. ? Difficulty swallowing or it hurts to swallow. ? Wheezing or a persistent cough. ? A hoarse voice.  Your symptoms do not improve with treatment. Get help right away if you:  Have pain in your arms, neck, jaw, teeth, or back.  Feel sweaty, dizzy, or light-headed.  Have chest pain or shortness of breath.  Vomit and your vomit looks  like blood or coffee grounds.  Faint.  Have stool that is bloody or black.  Cannot swallow, drink, or eat. Summary  Gastroesophageal reflux happens when acid from the stomach flows up into the esophagus. GERD is a disease in which the reflux happens often, causes frequent or severe symptoms, or causes problems such as damage to the esophagus.  Treatment for this condition may vary depending on how severe your symptoms are. Your health care provider may recommend diet and lifestyle changes, medicine, or surgery.  Contact a health care provider if you have new or worsening symptoms.  Take over-the-counter and prescription medicines only as told by your health care provider. Do not take aspirin, ibuprofen, or other NSAIDs unless your health care provider told you to do so.  Keep all follow-up visits as told by your health care provider. This is important. This information is not intended to replace advice given to you by your health care provider. Make sure you discuss any questions you have with your health care provider. Document Revised: 01/17/2018 Document Reviewed: 01/17/2018 Elsevier Patient Education  2020 [employer] Safe Medications in Pregnancy   Acne: Benzoyl Peroxide Salicylic Acid  Backache/Headache: Tylenol: 2 regular strength every 4 hours OR                2 Extra strength every 6 hours  Colds/Coughs/Allergies: Benadryl (alcohol free) 25 mg every 6 hours as needed Breath right strips Claritin Cepacol throat lozenges Chloraseptic throat spray Cold-Eeze- up to three times per day Cough drops, alcohol free Flonase (by prescription only) Guaifenesin Mucinex Robitussin DM (plain only, alcohol free) Saline nasal spray/drops Sudafed (pseudoephedrine) & Actifed ** use only after [redacted] weeks gestation and if you do not have high blood pressure Tylenol Vicks Vaporub Zinc lozenges Zyrtec   Constipation: Colace Ducolax suppositories Fleet enema Glycerin  suppositories Metamucil Milk of magnesia Miralax Senokot Smooth move tea  Diarrhea: Kaopectate Imodium A-D  *NO pepto Bismol  Hemorrhoids: Anusol Anusol HC Preparation H Tucks  Indigestion: Tums Maalox Mylanta Zantac  Pepcid  Insomnia: Benadryl (alcohol free) 25mg every 6 hours as needed Tylenol PM Unisom, no Gelcaps  Leg Cramps: Tums MagGel  Nausea/Vomiting:  Bonine Dramamine Emetrol Ginger extract Sea bands Meclizine  Nausea medication to take during pregnancy:  Unisom (doxylamine succinate 25 mg tablets) Take one tablet daily at bedtime. If symptoms are not adequately controlled, the dose can be increased to a maximum recommended dose of two tablets daily (1/2 tablet in the morning, 1/2 tablet mid-afternoon and one at bedtime). Vitamin B6 100mg tablets. Take one tablet twice a day (up to 200 mg per day).  Skin Rashes: Aveeno products Benadryl cream or 25mg every 6 hours as needed Calamine Lotion 1% cortisone cream  Yeast infection: Gyne-lotrimin 7 Monistat 7  Gum/tooth pain: Anbesol  **If taking multiple medications, please check labels to avoid duplicating the same active ingredients **take medication as directed on the label ** Do not exceed 4000 mg of tylenol in 24 hours **Do not take medications that contain aspirin or ibuprofen     

## 2020-07-01 ENCOUNTER — Ambulatory Visit: Payer: Medicaid Other

## 2020-07-03 ENCOUNTER — Encounter: Payer: Self-pay | Admitting: *Deleted

## 2020-07-03 ENCOUNTER — Ambulatory Visit (HOSPITAL_BASED_OUTPATIENT_CLINIC_OR_DEPARTMENT_OTHER): Payer: Medicaid Other

## 2020-07-03 ENCOUNTER — Other Ambulatory Visit: Payer: Self-pay

## 2020-07-03 ENCOUNTER — Other Ambulatory Visit: Payer: Self-pay | Admitting: *Deleted

## 2020-07-03 ENCOUNTER — Ambulatory Visit: Payer: Medicaid Other | Attending: Obstetrics and Gynecology | Admitting: *Deleted

## 2020-07-03 DIAGNOSIS — O99213 Obesity complicating pregnancy, third trimester: Secondary | ICD-10-CM | POA: Diagnosis not present

## 2020-07-03 DIAGNOSIS — Z362 Encounter for other antenatal screening follow-up: Secondary | ICD-10-CM | POA: Diagnosis not present

## 2020-07-03 DIAGNOSIS — O30043 Twin pregnancy, dichorionic/diamniotic, third trimester: Secondary | ICD-10-CM | POA: Diagnosis not present

## 2020-07-03 DIAGNOSIS — O403XX1 Polyhydramnios, third trimester, fetus 1: Secondary | ICD-10-CM | POA: Insufficient documentation

## 2020-07-03 DIAGNOSIS — O09893 Supervision of other high risk pregnancies, third trimester: Secondary | ICD-10-CM | POA: Diagnosis not present

## 2020-07-03 DIAGNOSIS — Z3A34 34 weeks gestation of pregnancy: Secondary | ICD-10-CM | POA: Insufficient documentation

## 2020-07-03 DIAGNOSIS — Z148 Genetic carrier of other disease: Secondary | ICD-10-CM

## 2020-07-03 DIAGNOSIS — O0990 Supervision of high risk pregnancy, unspecified, unspecified trimester: Secondary | ICD-10-CM

## 2020-07-03 DIAGNOSIS — E669 Obesity, unspecified: Secondary | ICD-10-CM

## 2020-07-03 DIAGNOSIS — D563 Thalassemia minor: Secondary | ICD-10-CM

## 2020-07-03 NOTE — Progress Notes (Signed)
C/o"spots"

## 2020-07-08 ENCOUNTER — Encounter: Payer: Self-pay | Admitting: *Deleted

## 2020-07-08 ENCOUNTER — Ambulatory Visit: Payer: Medicaid Other | Admitting: *Deleted

## 2020-07-08 ENCOUNTER — Ambulatory Visit: Payer: Medicaid Other | Attending: Obstetrics and Gynecology | Admitting: *Deleted

## 2020-07-08 ENCOUNTER — Other Ambulatory Visit: Payer: Self-pay

## 2020-07-08 DIAGNOSIS — Z3A35 35 weeks gestation of pregnancy: Secondary | ICD-10-CM | POA: Diagnosis not present

## 2020-07-08 DIAGNOSIS — O4703 False labor before 37 completed weeks of gestation, third trimester: Secondary | ICD-10-CM | POA: Diagnosis not present

## 2020-07-08 DIAGNOSIS — O30049 Twin pregnancy, dichorionic/diamniotic, unspecified trimester: Secondary | ICD-10-CM | POA: Insufficient documentation

## 2020-07-08 DIAGNOSIS — D563 Thalassemia minor: Secondary | ICD-10-CM

## 2020-07-08 DIAGNOSIS — O0990 Supervision of high risk pregnancy, unspecified, unspecified trimester: Secondary | ICD-10-CM

## 2020-07-08 NOTE — Procedures (Signed)
Raven Medina Dec 07, 2000 [redacted]w[redacted]d  Fetus A Non-Stress Test Interpretation for 07/08/20  Indication: Di/Di twins  Fetal Heart Rate A Mode: External Baseline Rate (A): 135 bpm Variability: Moderate Accelerations: 15 x 15 Decelerations: None Multiple birth?: Yes  Uterine Activity Mode: Palpation Contraction Frequency (min): Not able to use toco due to excessive EFM, Pt reports very irregular U/C's Resting Tone Palpated: Relaxed Resting Time: Adequate  Interpretation (Fetal Testing) Nonstress Test Interpretation: Reactive Overall Impression: Reassuring for gestational age Comments: Reviewed tracing with Raven Medina October 12, 2000 [redacted]w[redacted]d   Fetus B Non-Stress Test Interpretation for 07/08/20  Indication: Di/Di twins  Fetal Heart Rate Fetus B Mode: External,Doppler Baseline Rate (B): 135 BPM Variability: Moderate Accelerations: 15 x 15 Decelerations: None  Uterine Activity Mode: Palpation Contraction Frequency (min): Not able to use toco due to excessive EFM, Pt reports very irregular U/C's Resting Tone Palpated: Relaxed Resting Time: Adequate  Interpretation (Baby B - Fetal Testing) Nonstress Test Interpretation (Baby B): Reactive Overall Impression (Baby B): Reassuring for gestational age Comments 59 B): Reviewed tracing with Raven Medina

## 2020-07-10 ENCOUNTER — Telehealth: Payer: Self-pay | Admitting: Family Medicine

## 2020-07-10 ENCOUNTER — Other Ambulatory Visit: Payer: Self-pay

## 2020-07-10 ENCOUNTER — Inpatient Hospital Stay (HOSPITAL_COMMUNITY): Payer: Medicaid Other

## 2020-07-10 ENCOUNTER — Inpatient Hospital Stay (HOSPITAL_COMMUNITY)
Admission: AD | Admit: 2020-07-10 | Discharge: 2020-07-17 | DRG: 786 | Disposition: A | Discharge status: Home / Self Care | Payer: Medicaid Other | Attending: Obstetrics and Gynecology | Admitting: Obstetrics and Gynecology

## 2020-07-10 ENCOUNTER — Encounter (HOSPITAL_COMMUNITY): Payer: Self-pay | Admitting: Obstetrics and Gynecology

## 2020-07-10 DIAGNOSIS — O322XX1 Maternal care for transverse and oblique lie, fetus 1: Secondary | ICD-10-CM

## 2020-07-10 DIAGNOSIS — Z148 Genetic carrier of other disease: Secondary | ICD-10-CM

## 2020-07-10 DIAGNOSIS — Z20822 Contact with and (suspected) exposure to covid-19: Secondary | ICD-10-CM | POA: Diagnosis present

## 2020-07-10 DIAGNOSIS — J90 Pleural effusion, not elsewhere classified: Secondary | ICD-10-CM | POA: Diagnosis not present

## 2020-07-10 DIAGNOSIS — O30043 Twin pregnancy, dichorionic/diamniotic, third trimester: Secondary | ICD-10-CM

## 2020-07-10 DIAGNOSIS — E669 Obesity, unspecified: Secondary | ICD-10-CM

## 2020-07-10 DIAGNOSIS — O99213 Obesity complicating pregnancy, third trimester: Secondary | ICD-10-CM | POA: Diagnosis not present

## 2020-07-10 DIAGNOSIS — Z87891 Personal history of nicotine dependence: Secondary | ICD-10-CM

## 2020-07-10 DIAGNOSIS — O09893 Supervision of other high risk pregnancies, third trimester: Secondary | ICD-10-CM | POA: Diagnosis not present

## 2020-07-10 DIAGNOSIS — D563 Thalassemia minor: Secondary | ICD-10-CM | POA: Diagnosis present

## 2020-07-10 DIAGNOSIS — O321XX1 Maternal care for breech presentation, fetus 1: Principal | ICD-10-CM | POA: Diagnosis present

## 2020-07-10 DIAGNOSIS — O30049 Twin pregnancy, dichorionic/diamniotic, unspecified trimester: Secondary | ICD-10-CM | POA: Diagnosis present

## 2020-07-10 DIAGNOSIS — O0990 Supervision of high risk pregnancy, unspecified, unspecified trimester: Secondary | ICD-10-CM

## 2020-07-10 DIAGNOSIS — O322XX2 Maternal care for transverse and oblique lie, fetus 2: Secondary | ICD-10-CM | POA: Diagnosis not present

## 2020-07-10 DIAGNOSIS — Z362 Encounter for other antenatal screening follow-up: Secondary | ICD-10-CM | POA: Diagnosis not present

## 2020-07-10 DIAGNOSIS — Z3A35 35 weeks gestation of pregnancy: Secondary | ICD-10-CM

## 2020-07-10 DIAGNOSIS — R0902 Hypoxemia: Secondary | ICD-10-CM

## 2020-07-10 DIAGNOSIS — O10013 Pre-existing essential hypertension complicating pregnancy, third trimester: Secondary | ICD-10-CM | POA: Diagnosis not present

## 2020-07-10 DIAGNOSIS — O321XX2 Maternal care for breech presentation, fetus 2: Secondary | ICD-10-CM | POA: Diagnosis not present

## 2020-07-10 DIAGNOSIS — Z98891 History of uterine scar from previous surgery: Secondary | ICD-10-CM | POA: Diagnosis not present

## 2020-07-10 DIAGNOSIS — D649 Anemia, unspecified: Secondary | ICD-10-CM | POA: Diagnosis not present

## 2020-07-10 DIAGNOSIS — O4703 False labor before 37 completed weeks of gestation, third trimester: Secondary | ICD-10-CM | POA: Diagnosis not present

## 2020-07-10 DIAGNOSIS — O320XX1 Maternal care for unstable lie, fetus 1: Secondary | ICD-10-CM | POA: Diagnosis not present

## 2020-07-10 DIAGNOSIS — O321XX0 Maternal care for breech presentation, not applicable or unspecified: Secondary | ICD-10-CM | POA: Diagnosis not present

## 2020-07-10 DIAGNOSIS — O9902 Anemia complicating childbirth: Secondary | ICD-10-CM | POA: Diagnosis present

## 2020-07-10 DIAGNOSIS — Z3A36 36 weeks gestation of pregnancy: Secondary | ICD-10-CM

## 2020-07-10 LAB — CBC
HCT: 22.2 % — ABNORMAL LOW (ref 36.0–46.0)
Hemoglobin: 6.4 g/dL — CL (ref 12.0–15.0)
MCH: 19.9 pg — ABNORMAL LOW (ref 26.0–34.0)
MCHC: 28.8 g/dL — ABNORMAL LOW (ref 30.0–36.0)
MCV: 68.9 fL — ABNORMAL LOW (ref 80.0–100.0)
Platelets: 169 10*3/uL (ref 150–400)
RBC: 3.22 MIL/uL — ABNORMAL LOW (ref 3.87–5.11)
RDW: 18.8 % — ABNORMAL HIGH (ref 11.5–15.5)
WBC: 7.3 10*3/uL (ref 4.0–10.5)
nRBC: 0 % (ref 0.0–0.2)

## 2020-07-10 LAB — URINALYSIS, ROUTINE W REFLEX MICROSCOPIC
Bilirubin Urine: NEGATIVE
Glucose, UA: NEGATIVE mg/dL
Hgb urine dipstick: NEGATIVE
Ketones, ur: 20 mg/dL — AB
Leukocytes,Ua: NEGATIVE
Nitrite: NEGATIVE
Protein, ur: 30 mg/dL — AB
Specific Gravity, Urine: 1.023 (ref 1.005–1.030)
pH: 7 (ref 5.0–8.0)

## 2020-07-10 LAB — RESP PANEL BY RT-PCR (FLU A&B, COVID) ARPGX2
Influenza A by PCR: NEGATIVE
Influenza B by PCR: NEGATIVE
SARS Coronavirus 2 by RT PCR: NEGATIVE

## 2020-07-10 MED ORDER — PRENATAL MULTIVITAMIN CH
1.0000 | ORAL_TABLET | Freq: Every day | ORAL | Status: DC
  Administered 2020-07-12 – 2020-07-17 (×5): 1 via ORAL
  Filled 2020-07-10 (×6): qty 1

## 2020-07-10 MED ORDER — ACETAMINOPHEN 325 MG PO TABS
650.0000 mg | ORAL_TABLET | ORAL | Status: DC | PRN
  Administered 2020-07-11 – 2020-07-13 (×5): 650 mg via ORAL
  Filled 2020-07-10 (×5): qty 2

## 2020-07-10 MED ORDER — SODIUM CHLORIDE 0.9 % IV SOLN
500.0000 mg | Freq: Once | INTRAVENOUS | Status: AC
  Administered 2020-07-10: 23:00:00 500 mg via INTRAVENOUS
  Filled 2020-07-10: qty 25

## 2020-07-10 MED ORDER — CALCIUM CARBONATE ANTACID 500 MG PO CHEW
2.0000 | CHEWABLE_TABLET | ORAL | Status: DC | PRN
  Administered 2020-07-11 – 2020-07-12 (×5): 400 mg via ORAL
  Filled 2020-07-10 (×5): qty 2

## 2020-07-10 MED ORDER — BETAMETHASONE SOD PHOS & ACET 6 (3-3) MG/ML IJ SUSP
12.0000 mg | INTRAMUSCULAR | Status: AC
  Administered 2020-07-10 – 2020-07-11 (×2): 12 mg via INTRAMUSCULAR
  Filled 2020-07-10: qty 5

## 2020-07-10 MED ORDER — DOCUSATE SODIUM 100 MG PO CAPS
100.0000 mg | ORAL_CAPSULE | Freq: Every day | ORAL | Status: DC
  Administered 2020-07-15: 08:00:00 100 mg via ORAL
  Filled 2020-07-10 (×6): qty 1

## 2020-07-10 MED ORDER — ZOLPIDEM TARTRATE 5 MG PO TABS: 5.0000 mg | ORAL_TABLET | Freq: Every evening | ORAL | Status: DC | PRN

## 2020-07-10 MED ORDER — FAMOTIDINE IN NACL 20-0.9 MG/50ML-% IV SOLN
20.0000 mg | Freq: Once | INTRAVENOUS | Status: AC
  Administered 2020-07-10: 14:00:00 20 mg via INTRAVENOUS
  Filled 2020-07-10: qty 50

## 2020-07-10 MED ORDER — LACTATED RINGERS IV BOLUS
1000.0000 mL | Freq: Once | INTRAVENOUS | Status: AC
  Administered 2020-07-10: 13:00:00 1000 mL via INTRAVENOUS

## 2020-07-10 MED ORDER — LACTATED RINGERS IV SOLN: INTRAVENOUS | Status: DC

## 2020-07-10 MED ORDER — TERBUTALINE SULFATE 1 MG/ML IJ SOLN
0.2500 mg | Freq: Once | INTRAMUSCULAR | Status: AC
  Administered 2020-07-10: 15:00:00 0.25 mg via SUBCUTANEOUS
  Filled 2020-07-10: qty 1

## 2020-07-10 NOTE — H&P (Signed)
Chief Complaint:  Contractions   Event Date/Time   First Provider Initiated Contact with Patient 07/10/20 1227      HPI: Raven Medina is a 19 y.o. G2P1001 at [redacted]w[redacted]d with di/di twins gestation admitted for threatened preterm labor and fetal malposition.  Cervix changed from 4- 5 cm dilated in 2 hours in MAU and Baby A noted to be in breech position, Baby B transverse. She reports good fetal movement.    HPI  Past Medical History: Past Medical History:  Diagnosis Date  . ADHD (attention deficit hyperactivity disorder)   . Allergy    seasonal  . Asthma    intermittent  . Iron deficiency anemia 04/17/2014  . Obesity   . Pregnancy induced hypertension   . Supervision of normal first pregnancy, antepartum 02/19/2019    Nursing Staff Provider Office Location [facility] Dating  LMP Language  English Anatomy US  Normal - incomplete - FU > Normal  Flu Vaccine  Declined 06/27/2019 Genetic Screen  NIPS: LR girl   AFP:  TDaP vaccine   Declined 06/27/2019 Hgb A1C or  GTT Early 5.4 Third trimester nmL 75-117-91 Rhogam  NA   LAB RESULTS  Feeding Plan Pumping Breast/bottle Blood Type O/Positive/-- (08/05 1541)  Contracepti    Past obstetric history: OB History  Gravida Para Term Preterm AB Living  2 1 1     1   SAB IAB Ectopic Multiple Live Births        0 1    # Outcome Date GA Lbr Len/2nd Weight Sex Delivery Anes PTL Lv  2 Current           1 Term 09/22/19 [redacted]w[redacted]d 04:50 / 00:32 3615 g F Vag-Spont EPI  LIV     Birth Comments: WDL    Past Surgical History: Past Surgical History:  Procedure Laterality Date  . HERNIA REPAIR    . myringotomy with tubes Bilateral    at age 69  . WISDOM TOOTH EXTRACTION      Family History: Family History  Problem Relation Age of Onset  . Hypertension Mother   . Hypertension Maternal Grandmother   . Hypertension Maternal Grandfather     Social History: Social History   Tobacco Use  . Smoking status: Former Smoker    Types: Cigars  . Smokeless  tobacco: Never Used  . Tobacco comment: mother smokes inside home  Vaping Use  . Vaping Use: Never used  Substance Use Topics  . Alcohol use: Not Currently    Alcohol/week: 0.0 standard drinks    Comment: Social when not pregnant  . Drug use: Not Currently    Types: Marijuana    Comment: Last smoked 01/08/19    Allergies: No Known Allergies  Meds:  Medications Prior to Admission  Medication Sig Dispense Refill Last Dose  . Elastic Bandages & Supports (COMFORT FIT MATERNITY SUPP SM) MISC 1 Units by Does not apply route daily as needed. 1 each 0   . folic acid (FOLVITE) 1 MG tablet Take 1 tablet (1 mg total) by mouth daily. (Patient not taking: Reported on 04/03/2020) 60 tablet 2   . Misc. Devices (GOJJI WEIGHT SCALE) MISC 1 Device by Does not apply route daily as needed. To weight self daily as needed at home. ICD-10 code: O45.90 (Patient not taking: Reported on 05/18/2020) 1 each 0   . Prenatal Vit-Fe Fumarate-FA (PRENATAL PLUS/IRON) 27-1 MG TABS Take 1 tablet by mouth daily. (Patient not taking: Reported on 05/18/2020) 30 tablet 12  ROS:  Review of Systems  Constitutional: Negative for chills, fatigue and fever.  Eyes: Negative for visual disturbance.  Respiratory: Negative for shortness of breath.   Cardiovascular: Negative for chest pain.  Gastrointestinal: Negative for abdominal pain, nausea and vomiting.  Genitourinary: Negative for difficulty urinating, dysuria, flank pain, pelvic pain, vaginal bleeding, vaginal discharge and vaginal pain.  Neurological: Negative for dizziness and headaches.  Psychiatric/Behavioral: Negative.      I have reviewed patient's Past Medical Hx, Surgical Hx, Family Hx, Social Hx, medications and allergies.   Physical Exam   Patient Vitals for the past 24 hrs:  BP Temp Temp src Pulse Resp SpO2  07/10/20 1202 125/76 98.5 F (36.9 C) Oral (!) 119 16 100 %   Constitutional: Well-developed, well-nourished female in no acute distress.   Cardiovascular: normal rate Respiratory: normal effort GI: Abd soft, non-tender, gravid appropriate for gestational age.  MS: Extremities nontender, no edema, normal ROM Neurologic: Alert and oriented x 4.  GU: Neg CVAT.   Dilation: 5 Effacement (%): 70 Cervical Position: Posterior Station: Ballotable Presentation: Undeterminable Exam by:: Sharen Counter, CNM  FHT:  Baby A: Baseline 140, moderate variability, accelerations present, no decelerations Baby A: Baseline 135, moderate variability, accelerations present, no decelerations Contractions: None on toco or to palpation   Labs: Results for orders placed or performed during the hospital encounter of 07/10/20 (from the past 24 hour(s))  Urinalysis, Routine w reflex microscopic Urine, Clean Catch     Status: Abnormal   Collection Time: 07/10/20 12:24 PM  Result Value Ref Range   Color, Urine AMBER (A) YELLOW   APPearance CLEAR CLEAR   Specific Gravity, Urine 1.023 1.005 - 1.030   pH 7.0 5.0 - 8.0   Glucose, UA NEGATIVE NEGATIVE mg/dL   Hgb urine dipstick NEGATIVE NEGATIVE   Bilirubin Urine NEGATIVE NEGATIVE   Ketones, ur 20 (A) NEGATIVE mg/dL   Protein, ur 30 (A) NEGATIVE mg/dL   Nitrite NEGATIVE NEGATIVE   Leukocytes,Ua NEGATIVE NEGATIVE   RBC / HPF 0-5 0 - 5 RBC/hpf   WBC, UA 0-5 0 - 5 WBC/hpf   Bacteria, UA RARE (A) NONE SEEN   Squamous Epithelial / LPF 6-10 0 - 5   Mucus PRESENT    O/Positive/-- (07/01 1545)  Imaging:   Worksheet for Limited OB US done at bedside on 07/10/20 indicates Baby A in breech position, Baby B transverse.      MAU Course/MDM: Orders Placed This Encounter  Procedures  . Resp Panel by RT-PCR (Flu A&B, Covid) Nasopharyngeal Swab  . [place] MFM OB Limited  . Urinalysis, Routine w reflex microscopic Urine, Clean Catch  . Insert peripheral IV    Meds ordered this encounter  Medications  . lactated ringers bolus 1,000 mL  . famotidine (PEPCID) IVPB 20 mg premix  . terbutaline  (BRETHINE) injection 0.25 mg  . betamethasone acetate-betamethasone sodium phosphate (CELESTONE) injection 12 mg     NST reviewed and reactive x 2 Fetal small parts palpated on exam, cervix with advanced dilation at 4 cm Bedside US reveals transverse fetal lie for Baby A, formal US ordered at bedside for confirmation of position IV fluids, IV Pepcid ordered, will reevaluate Formal US confirms fetal positions as Baby A breech, Baby B transverse Cervix changed to 5/70/-2 in 2 hours in MAU, pt reports minimal pain but contractions on toco are 5-7 minutes apart Consult Dr Donavan Foil with presentation, exam findings and test results.  Terbutaline x 1 dose, BMZ x 1 now and 1  in 24 hours, admit to [facility] Unit for observation NPO on admission IV fluids continue Continuous EFM   Assessment: 1. Transverse lie of fetus, fetus 1 of multiple gestation   2. Alpha thalassemia silent carrier   3. Supervision of high risk pregnancy, antepartum   4. Threatened preterm labor, third trimester   5. Dichorionic diamniotic twin pregnancy in third trimester     Plan: Admit to [facility] Unit  Sharen Counter Certified Nurse-Midwife 07/10/2020 3:02 PM

## 2020-07-10 NOTE — Telephone Encounter (Signed)
Attempted to reach Raven Medina about her missed appointment. Scheduled her for the same day she has her ultrasound, and sent her a MyChart message about this appointment.

## 2020-07-10 NOTE — Progress Notes (Signed)
Dr. Debroah Loop notified of critical hemoglobin lab value of 6.4 @2016 . Provider to review chart.

## 2020-07-10 NOTE — MAU Note (Signed)
.   Raven Medina is a 19 y.o. at [redacted]w[redacted]d here in MAU reporting: ctx that started at 0700 this morning. They were 5 mins apart but she has not been timing them recently. No VB or LOF. No recent intercourse. DiDi twins.   Pain score: 5 Vitals:   07/10/20 1202  BP: 125/76  Pulse: (!) 119  Resp: 16  Temp: 98.5 F (36.9 C)  SpO2: 100%     FHT:A- 145 B-125 Lab orders placed from triage: UA

## 2020-07-10 NOTE — MAU Provider Note (Addendum)
Chief Complaint:  Contractions   Event Date/Time   First Provider Initiated Contact with Patient 07/10/20 1227      HPI: Raven Medina is a 19 y.o. G2P1001 at [redacted]w[redacted]d with di/di twins gestation who presents to maternity admissions reporting onset of abdominal cramping at 3 am this morning that continued, every 5 minutes for 1-2 hours. The contractions have stopped since arrival in MAU. She reports heartburn today. There are no other associated symptoms. She reports good fetal movement.    Location: low abdomen Quality: cramping Severity: 7/10 on pain scale Duration: 1 day Timing: intermittent, resolved prior to MAU check in  Modifying factors: none Associated signs and symptoms: none  HPI  Past Medical History: Past Medical History:  Diagnosis Date   ADHD (attention deficit hyperactivity disorder)    Allergy    seasonal   Asthma    intermittent   Iron deficiency anemia 04/17/2014   Obesity    Pregnancy induced hypertension    Supervision of normal first pregnancy, antepartum 02/19/2019    Nursing Staff Provider Office Location [facility] Dating  LMP Language  English Anatomy US  Normal - incomplete - FU > Normal  Flu Vaccine  Declined 06/27/2019 Genetic Screen  NIPS: LR girl   AFP:  TDaP vaccine   Declined 06/27/2019 Hgb A1C or  GTT Early 5.4 Third trimester nmL 75-117-91 Rhogam  NA   LAB RESULTS  Feeding Plan Pumping Breast/bottle Blood Type O/Positive/-- (08/05 1541)  Contracepti    Past obstetric history: OB History  Gravida Para Term Preterm AB Living  2 1 1     1   SAB IAB Ectopic Multiple Live Births        0 1    # Outcome Date GA Lbr Len/2nd Weight Sex Delivery Anes PTL Lv  2 Current           1 Term 09/22/19 [redacted]w[redacted]d 04:50 / 00:32 3615 g F Vag-Spont EPI  LIV     Birth Comments: WDL    Past Surgical History: Past Surgical History:  Procedure Laterality Date   HERNIA REPAIR     myringotomy with tubes Bilateral    at age 14   WISDOM TOOTH EXTRACTION       Family History: Family History  Problem Relation Age of Onset   Hypertension Mother    Hypertension Maternal Grandmother    Hypertension Maternal Grandfather     Social History: Social History   Tobacco Use   Smoking status: Former Smoker    Types: Cigars   Smokeless tobacco: Never Used   Tobacco comment: mother smokes inside home  Vaping Use   Vaping Use: Never used  Substance Use Topics   Alcohol use: Not Currently    Alcohol/week: 0.0 standard drinks    Comment: Social when not pregnant   Drug use: Not Currently    Types: Marijuana    Comment: Last smoked 01/08/19    Allergies: No Known Allergies  Meds:  Medications Prior to Admission  Medication Sig Dispense Refill Last Dose   Elastic Bandages & Supports (COMFORT FIT MATERNITY SUPP SM) MISC 1 Units by Does not apply route daily as needed. 1 each 0    folic acid (FOLVITE) 1 MG tablet Take 1 tablet (1 mg total) by mouth daily. (Patient not taking: Reported on 04/03/2020) 60 tablet 2    Misc. Devices (GOJJI WEIGHT SCALE) MISC 1 Device by Does not apply route daily as needed. To weight self daily as needed at home. ICD-10 code:  O09.90 (Patient not taking: Reported on 05/18/2020) 1 each 0    Prenatal Vit-Fe Fumarate-FA (PRENATAL PLUS/IRON) 27-1 MG TABS Take 1 tablet by mouth daily. (Patient not taking: Reported on 05/18/2020) 30 tablet 12     ROS:  Review of Systems  Constitutional: Negative for chills, fatigue and fever.  Eyes: Negative for visual disturbance.  Respiratory: Negative for shortness of breath.   Cardiovascular: Negative for chest pain.  Gastrointestinal: Negative for abdominal pain, nausea and vomiting.  Genitourinary: Negative for difficulty urinating, dysuria, flank pain, pelvic pain, vaginal bleeding, vaginal discharge and vaginal pain.  Neurological: Negative for dizziness and headaches.  Psychiatric/Behavioral: Negative.      I have reviewed patient's Past Medical Hx, Surgical Hx,  Family Hx, Social Hx, medications and allergies.   Physical Exam   Patient Vitals for the past 24 hrs:  BP Temp Temp src Pulse Resp SpO2  07/10/20 1202 125/76 98.5 F (36.9 C) Oral (!) 119 16 100 %   Constitutional: Well-developed, well-nourished female in no acute distress.  Cardiovascular: normal rate Respiratory: normal effort GI: Abd soft, non-tender, gravid appropriate for gestational age.  MS: Extremities nontender, no edema, normal ROM Neurologic: Alert and oriented x 4.  GU: Neg CVAT.   Dilation: 5 Effacement (%): 70 Cervical Position: Posterior Station: Ballotable Presentation: Undeterminable Exam by:: Sharen Counter, CNM  FHT:  Baby A: Baseline 140, moderate variability, accelerations present, no decelerations Baby A: Baseline 135, moderate variability, accelerations present, no decelerations Contractions: None on toco or to palpation   Labs: Results for orders placed or performed during the hospital encounter of 07/10/20 (from the past 24 hour(s))  Urinalysis, Routine w reflex microscopic Urine, Clean Catch     Status: Abnormal   Collection Time: 07/10/20 12:24 PM  Result Value Ref Range   Color, Urine AMBER (A) YELLOW   APPearance CLEAR CLEAR   Specific Gravity, Urine 1.023 1.005 - 1.030   pH 7.0 5.0 - 8.0   Glucose, UA NEGATIVE NEGATIVE mg/dL   Hgb urine dipstick NEGATIVE NEGATIVE   Bilirubin Urine NEGATIVE NEGATIVE   Ketones, ur 20 (A) NEGATIVE mg/dL   Protein, ur 30 (A) NEGATIVE mg/dL   Nitrite NEGATIVE NEGATIVE   Leukocytes,Ua NEGATIVE NEGATIVE   RBC / HPF 0-5 0 - 5 RBC/hpf   WBC, UA 0-5 0 - 5 WBC/hpf   Bacteria, UA RARE (A) NONE SEEN   Squamous Epithelial / LPF 6-10 0 - 5   Mucus PRESENT    O/Positive/-- (07/01 1545)  Imaging:   Worksheet for Limited OB US done at bedside on 07/10/20 indicates Baby A in breech position, Baby B transverse.      MAU Course/MDM: Orders Placed This Encounter  Procedures   Resp Panel by RT-PCR (Flu A&B,  Covid) Nasopharyngeal Swab   [place] MFM OB Limited   Urinalysis, Routine w reflex microscopic Urine, Clean Catch   Insert peripheral IV    Meds ordered this encounter  Medications   lactated ringers bolus 1,000 mL   famotidine (PEPCID) IVPB 20 mg premix   terbutaline (BRETHINE) injection 0.25 mg   betamethasone acetate-betamethasone sodium phosphate (CELESTONE) injection 12 mg     NST reviewed and reactive x 2 Fetal small parts palpated on exam, cervix with advanced dilation at 4 cm Bedside US reveals transverse fetal lie for Baby A, formal US ordered at bedside for confirmation of position IV fluids, IV Pepcid ordered, will reevaluate Formal US confirms fetal positions as Baby A breech, Baby B transverse Cervix changed  to 5/70/-2 in 2 hours in MAU, pt reports minimal pain but contractions on toco are 5-7 minutes apart Consult Dr Donavan Foil with presentation, exam findings and test results.  Terbutaline x 1 dose, BMZ x 1 now and 1 in 24 hours, admit to [facility] Unit for observation NPO on admission IV fluids continue Continuous EFM   Assessment: 1. Transverse lie of fetus, fetus 1 of multiple gestation   2. Alpha thalassemia silent carrier   3. Supervision of high risk pregnancy, antepartum   4. Threatened preterm labor, third trimester   5. Dichorionic diamniotic twin pregnancy in third trimester     Plan: Admit to [facility] Unit  Sharen Counter Certified Nurse-Midwife 07/10/2020 3:02 PM

## 2020-07-11 DIAGNOSIS — O30043 Twin pregnancy, dichorionic/diamniotic, third trimester: Secondary | ICD-10-CM

## 2020-07-11 DIAGNOSIS — Z3A35 35 weeks gestation of pregnancy: Secondary | ICD-10-CM

## 2020-07-11 DIAGNOSIS — O4703 False labor before 37 completed weeks of gestation, third trimester: Secondary | ICD-10-CM

## 2020-07-11 LAB — CBC
HCT: 22 % — ABNORMAL LOW (ref 36.0–46.0)
Hemoglobin: 6.3 g/dL — CL (ref 12.0–15.0)
MCH: 19.7 pg — ABNORMAL LOW (ref 26.0–34.0)
MCHC: 28.6 g/dL — ABNORMAL LOW (ref 30.0–36.0)
MCV: 69 fL — ABNORMAL LOW (ref 80.0–100.0)
Platelets: 172 10*3/uL (ref 150–400)
RBC: 3.19 MIL/uL — ABNORMAL LOW (ref 3.87–5.11)
RDW: 18.8 % — ABNORMAL HIGH (ref 11.5–15.5)
WBC: 7.2 10*3/uL (ref 4.0–10.5)
nRBC: 1.1 % — ABNORMAL HIGH (ref 0.0–0.2)

## 2020-07-11 LAB — COMPREHENSIVE METABOLIC PANEL
ALT: 8 U/L (ref 0–44)
AST: 16 U/L (ref 15–41)
Albumin: 2.2 g/dL — ABNORMAL LOW (ref 3.5–5.0)
Alkaline Phosphatase: 153 U/L — ABNORMAL HIGH (ref 38–126)
Anion gap: 8 (ref 5–15)
BUN: <5 mg/dL — ABNORMAL LOW (ref 6–20)
CO2: 22 mmol/L (ref 22–32)
Calcium: 8.4 mg/dL — ABNORMAL LOW (ref 8.9–10.3)
Chloride: 108 mmol/L (ref 98–111)
Creatinine, Ser: 0.64 mg/dL (ref 0.44–1.00)
GFR, Estimated: >60 mL/min (ref >60)
Glucose, Bld: 97 mg/dL (ref 70–99)
Potassium: 3.2 mmol/L — ABNORMAL LOW (ref 3.5–5.1)
Sodium: 138 mmol/L (ref 135–145)
Total Bilirubin: 0.8 mg/dL (ref 0.3–1.2)
Total Protein: 5.2 g/dL — ABNORMAL LOW (ref 6.5–8.1)

## 2020-07-11 LAB — RAPID URINE DRUG SCREEN, HOSP PERFORMED
Amphetamines: NOT DETECTED
Barbiturates: NOT DETECTED
Benzodiazepines: NOT DETECTED
Cocaine: NOT DETECTED
Opiates: NOT DETECTED
Tetrahydrocannabinol: NOT DETECTED

## 2020-07-11 LAB — PROTEIN / CREATININE RATIO, URINE
Creatinine, Urine: 92.29 mg/dL
Protein Creatinine Ratio: 0.1 mg/mg{Cre} (ref 0.00–0.15)
Total Protein, Urine: 9 mg/dL

## 2020-07-11 LAB — PREPARE RBC (CROSSMATCH)

## 2020-07-11 MED ORDER — BUTALBITAL-APAP-CAFFEINE 50-325-40 MG PO TABS
2.0000 | ORAL_TABLET | Freq: Four times a day (QID) | ORAL | Status: DC | PRN
  Filled 2020-07-11: qty 2

## 2020-07-11 MED ORDER — FUROSEMIDE 10 MG/ML IJ SOLN
20.0000 mg | Freq: Once | INTRAMUSCULAR | Status: AC
  Administered 2020-07-12: 01:00:00 20 mg via INTRAVENOUS
  Filled 2020-07-11: qty 2

## 2020-07-11 MED ORDER — DIPHENHYDRAMINE HCL 25 MG PO CAPS
25.0000 mg | ORAL_CAPSULE | Freq: Once | ORAL | Status: AC
  Administered 2020-07-11: 14:00:00 25 mg via ORAL
  Filled 2020-07-11: qty 1

## 2020-07-11 MED ORDER — NIFEDIPINE ER OSMOTIC RELEASE 30 MG PO TB24
30.0000 mg | ORAL_TABLET | Freq: Once | ORAL | Status: AC
  Administered 2020-07-11: 09:00:00 30 mg via ORAL
  Filled 2020-07-11: qty 1

## 2020-07-11 MED ORDER — ACETAMINOPHEN 500 MG PO TABS
1000.0000 mg | ORAL_TABLET | Freq: Once | ORAL | Status: AC
  Administered 2020-07-11: 14:00:00 1000 mg via ORAL
  Filled 2020-07-11: qty 2

## 2020-07-11 MED ORDER — SODIUM CHLORIDE 0.9% IV SOLUTION: Freq: Once | INTRAVENOUS | Status: AC

## 2020-07-11 MED ORDER — LACTATED RINGERS IV BOLUS
500.0000 mL | Freq: Once | INTRAVENOUS | Status: AC
  Administered 2020-07-11: 09:00:00 500 mL via INTRAVENOUS

## 2020-07-11 MED ORDER — DIPHENHYDRAMINE-ZINC ACETATE 2-0.1 % EX CREA
TOPICAL_CREAM | Freq: Every day | CUTANEOUS | Status: DC | PRN
  Filled 2020-07-11: qty 28

## 2020-07-11 NOTE — Consult Note (Signed)
Neonatology Consult to Antenatal Patient:  I was asked by Dr. Macon Large to see this patient in order to provide antenatal counseling due to premature twins.  [name] was admitted yesterday due to at 39 3/[redacted] weeks GA for threatened preterm labor and fetal malposition. Didi twins.  Good growth.  Reassuring fetal status.  No rupture or bleeding.  She is currently having some contractions but is not having active labor.  Alpha thalassemia silent carrier.  She is now steroid complete and is receiving Procardia and a pRBC transfusion.  Mother is 19yo and anxious with questions / concerns.    I spoke with the patient and FOB with nurse also at bedside. We discussed the worst case of delivery in the next 1-2 days, including usual DR management, possible respiratory complications and need for support, IV access, feedings (mother desires breast feeding, which was encouraged, and agrees to donor breast milk if needed), LOS, Mortality and Morbidity, and long term outcomes at this GA  They did have several typical questions which I was able to answer. I/we would d would be glad to come back if they have more questions later.  They both expressed understanding and gratitude for the visit / conversation.    Thank you for asking me to see this patient.  Dineen Kid Leary Roca, MD Neonatologist  The total length of face-to-face or floor/unit time for this encounter was 25 minutes. Counseling and/or coordination of care was 40 minutes of the above.

## 2020-07-11 NOTE — Progress Notes (Signed)
CRITICAL VALUE ALERT  Critical Value:  Hemoglobin 6.3  Date & Time Notied:  07/11/2020 @ 1211, RN was notified by lab. Provider notified at 1212.   Provider Notified: Dr. Macon Large  Orders Received/Actions taken: MD request to see if pt will accept blood products. RN to pt room and pt is willing to accept blood. Provider messaged. Awaiting response from provider.

## 2020-07-11 NOTE — Progress Notes (Signed)
Discussed anemia with patient, recommended transfusion especially given that she may need cesarean section if she progresses in preterm labor and if presenting twin is still non-cephalic.  Risks and benefits of transfusion reviewed, all questions answered.  Patient agreed to this plan.  3 units pRBCs ordered with premedication and Lasix after 2nd unit.  Will monitor closely.  Patient continues to have irregular painful contractions, currently noted to have contractions every 6-8 minutes on tocometry, but patient feels about 1/3 of them. No LOF, no VB, +FM x 2 and Category 1 FHR x 2.  Has now received both doses of betamethasone. Will continue NPO for now, recheck cervix as indicated.  OR and Anesthesiology teams aware, Neonatology is also aware.  Talked to Dr. Leary Roca (Neonatology), he will come and talk  If patient progresses in preterm labor, will recheck fetal presentation and definitively determine mode of delivery.     Continue inpatient observation.   Jaynie Collins, MD

## 2020-07-11 NOTE — Progress Notes (Signed)
    Faculty Practice OB/GYN Attending Note  Subjective:  Called to evaluate patient with some pelvic pressure and some contractions. No bleeding. FHR reassuring x 2, no LOF or vaginal bleeding. Good FM x 2.   Admitted on 07/10/2020 for Threatened preterm labor, third trimester.    Objective:  Blood pressure 136/79, pulse 95, temperature 97.8 F (36.6 C), temperature source Oral, resp. rate 18, last menstrual period 10/24/2019, SpO2 98 %, not currently breastfeeding. FHTx 2  Baseline 130 bpm, moderate variability, +accelerations, no decelerations>>Reactive x 2 Toco: irritability Gen: NAD HENT: Normocephalic, atraumatic Lungs: Normal respiratory effort Heart: Regular rate noted Abdomen: NT gravid fundus, soft Ext: 2+ DTRs, no edema, no cyanosis, negative Homan's sign Cervix: Dilation: 5 Effacement (%): 50 Cervical Position: Posterior Station: Ballotable Presentation: Undeterminable Exam by:: Dr. Macon Large  Bedside scan: Presenting twin still breech  Assessment & Plan:  19 y.o. G2P1001 at [redacted]w[redacted]d admitted for threatened preterm labor, twin gestation.  No cervical change, category 1 FHR x 2.  Just had breakfast, NPO started now.  Will give IV fluids, Procardia XL 30 mg po x 1.  Scheduled to receive BMZ at 1500 today. Continue close observation for now.   Jaynie Collins, MD, FACOG Obstetrician & Gynecologist, [facility] for [employer], [facility]

## 2020-07-12 DIAGNOSIS — O322XX2 Maternal care for transverse and oblique lie, fetus 2: Secondary | ICD-10-CM

## 2020-07-12 DIAGNOSIS — O320XX1 Maternal care for unstable lie, fetus 1: Secondary | ICD-10-CM

## 2020-07-12 LAB — CULTURE, BETA STREP (GROUP B ONLY)

## 2020-07-12 LAB — CBC
HCT: 28.2 % — ABNORMAL LOW (ref 36.0–46.0)
Hemoglobin: 8.4 g/dL — ABNORMAL LOW (ref 12.0–15.0)
MCH: 21.6 pg — ABNORMAL LOW (ref 26.0–34.0)
MCHC: 29.8 g/dL — ABNORMAL LOW (ref 30.0–36.0)
MCV: 72.7 fL — ABNORMAL LOW (ref 80.0–100.0)
Platelets: 175 10*3/uL (ref 150–400)
RBC: 3.88 MIL/uL (ref 3.87–5.11)
RDW: 21.2 % — ABNORMAL HIGH (ref 11.5–15.5)
WBC: 8.6 10*3/uL (ref 4.0–10.5)
nRBC: 1.6 % — ABNORMAL HIGH (ref 0.0–0.2)

## 2020-07-12 NOTE — Progress Notes (Signed)
Patient ID: Raven Medina, female   DOB: 03-02-2001, 19 y.o.   MRN: [id number] FACULTY PRACTICE ANTEPARTUM(COMPREHENSIVE) NOTE  Raven Medina is a 19 y.o. G2P1001 with Estimated Date of Delivery: 08/11/20   By   [redacted]w[redacted]d  who is admitted for Preterm labor.    Fetal presentation is unstable to transverse. Length of Stay:  2  Days  Date of admission:07/10/2020  Subjective: No complaints this am Patient reports the fetal movement as active. Patient reports uterine contraction  activity as none. Patient reports  vaginal bleeding as none. Patient describes fluid per vagina as None.  Vitals:  Blood pressure 125/63, pulse (!) 114, temperature 97.9 F (36.6 C), temperature source Oral, resp. rate 20, last menstrual period 10/24/2019, SpO2 100 %, not currently breastfeeding. Vitals:   07/12/20 0134 07/12/20 0153 07/12/20 0344 07/12/20 0739  BP: 123/66 140/83 (!) 118/56 125/63  Pulse: (!) 120 (!) 108 (!) 107 (!) 114  Resp: 20 19 20 20   Temp: 98.3 F (36.8 C) 98.1 F (36.7 C) 98.2 F (36.8 C) 97.9 F (36.6 C)  TempSrc: Oral Oral Oral Oral  SpO2: 96% 100% 100% 100%   Physical Examination:  General appearance - alert, well appearing, and in no distress Abdomen - soft, nontender, nondistended, no masses or organomegaly Fundal Height:  size greater than dates Pelvic Exam:  examination not indicated Cervical Exam: Not evaluated. Extremities: extremities normal, atraumatic, no cyanosis or edema with DTRs 2+ bilaterally Membranes:intact  Fetal Monitoring:  Baseline: 140s x 2 bpm, Variability: Good {> 6 bpm), Accelerations: Reactive and Decelerations: Absent   Reactive for both twins  Labs:  Results for orders placed or performed during the hospital encounter of 07/10/20 (from the past 24 hour(s))  CBC   Collection Time: 07/11/20 11:50 AM  Result Value Ref Range   WBC 7.2 4.0 - 10.5 K/uL   RBC 3.19 (L) 3.87 - 5.11 MIL/uL   Hemoglobin 6.3 (LL) 12.0 - 15.0 g/dL   HCT 07/13/20 (L) 51.7 - 00.1 %    MCV 69.0 (L) 80.0 - 100.0 fL   MCH 19.7 (L) 26.0 - 34.0 pg   MCHC 28.6 (L) 30.0 - 36.0 g/dL   RDW 74.9 (H) 44.9 - 67.5 %   Platelets 172 150 - 400 K/uL   nRBC 1.1 (H) 0.0 - 0.2 %  Comprehensive metabolic panel   Collection Time: 07/11/20 11:50 AM  Result Value Ref Range   Sodium 138 135 - 145 mmol/L   Potassium 3.2 (L) 3.5 - 5.1 mmol/L   Chloride 108 98 - 111 mmol/L   CO2 22 22 - 32 mmol/L   Glucose, Bld 97 70 - 99 mg/dL   BUN <5 (L) 6 - 20 mg/dL   Creatinine, Ser 07/13/20 0.44 - 1.00 mg/dL   Calcium 8.4 (L) 8.9 - 10.3 mg/dL   Total Protein 5.2 (L) 6.5 - 8.1 g/dL   Albumin 2.2 (L) 3.5 - 5.0 g/dL   AST 16 15 - 41 U/L   ALT 8 0 - 44 U/L   Alkaline Phosphatase 153 (H) 38 - 126 U/L   Total Bilirubin 0.8 0.3 - 1.2 mg/dL   GFR, Estimated 3.84 >66 mL/min   Anion gap 8 5 - 15  Protein / creatinine ratio, urine   Collection Time: 07/11/20 12:25 PM  Result Value Ref Range   Creatinine, Urine 92.29 mg/dL   Total Protein, Urine 9 mg/dL   Protein Creatinine Ratio 0.10 0.00 - 0.15 mg/mg[Cre]  Rapid urine drug screen (hospital performed)  Collection Time: 07/11/20 12:25 PM  Result Value Ref Range   Opiates NONE DETECTED NONE DETECTED   Cocaine NONE DETECTED NONE DETECTED   Benzodiazepines NONE DETECTED NONE DETECTED   Amphetamines NONE DETECTED NONE DETECTED   Tetrahydrocannabinol NONE DETECTED NONE DETECTED   Barbiturates NONE DETECTED NONE DETECTED  Prepare RBC (crossmatch)   Collection Time: 07/11/20  1:52 PM  Result Value Ref Range   Order Confirmation      ORDER PROCESSED BY BLOOD BANK Performed at [facility], [address], [place], [place] [address]     Imaging Studies:    [place] MFM OB Limited  Result Date: 07/10/2020 ----------------------------------------------------------------------  OBSTETRICS REPORT                       (Signed Final 07/10/2020 02:54 pm) ---------------------------------------------------------------------- Patient Info  ID #:       [id number]                           D.O.B.:  [birth year] (19 yrs)  Name:       Raven Medina                Visit Date: 07/10/2020 01:58 pm ---------------------------------------------------------------------- Performed By  Attending:        Ma Rings MD         Ref. Address:     [address]                                                             [place], [place]                                                             [address]  Performed By:     Reinaldo Raddle            [facility]  Referred By:      Raelyn Mora CNM ---------------------------------------------------------------------- Orders  #  Description                           Code        Ordered By  1  [place] MFM  OB LIMITED                     76815.01    LISA LEFTWICH-                                                       KIRBY ----------------------------------------------------------------------  #  Order #                     Accession #                Episode #  1  [id number]                   [id number]                 [id number] ---------------------------------------------------------------------- Indications  Twin pregnancy, di/di, third trimester         O30.043  [redacted] weeks gestation of pregnancy                Z3A.35  Obesity complicating pregnancy, third          O99.213  trimester (Pre-G BMI 33)  Encounter for other antenatal screening        Z36.2  follow-up  Genetic carrier (alpha thal silent carrier)    Z14.8  Short interval between pregancies, 3rd         O09.893  trimester  LR NIPS, neg AFP ---------------------------------------------------------------------- Fetal Evaluation (Fetus A)  Num Of Fetuses:         2  Fetal Heart Rate(bpm):  136  Cardiac Activity:       Observed  Presentation:           Breech  Placenta:               Posterior  Amniotic Fluid  AFI FV:       Within normal limits                              Largest Pocket(cm)                              3.67 ---------------------------------------------------------------------- OB History  Gravidity:    2         Term:   1  Living:       1 ---------------------------------------------------------------------- Gestational Age (Fetus A)  LMP:           37w 1d        Date:  10/24/19                 EDD:   07/30/20  Best:          Consuello Closs 3d     Det. ByMarcella Dubs         EDD:   08/11/20                                      (12/31/19) ---------------------------------------------------------------------- Anatomy (Fetus A)  Stomach:               Appears normal, left   Bladder:  Appears normal                         sided ---------------------------------------------------------------------- Fetal Evaluation (Fetus B)  Num Of Fetuses:         2  Fetal Heart Rate(bpm):  120  Cardiac Activity:       Observed  Presentation:           Transverse, head to maternal left  Placenta:               Anterior  P. Cord Insertion:      Previously Visualized  Amniotic Fluid  AFI FV:      Within normal limits                              Largest Pocket(cm)                              2.65 ---------------------------------------------------------------------- Gestational Age (Fetus B)  LMP:           37w 1d        Date:  10/24/19                 EDD:   07/30/20  Best:          Consuello Closs 3d     Det. ByMarcella Dubs         EDD:   08/11/20                                      (12/31/19) ---------------------------------------------------------------------- Anatomy (Fetus B)  Stomach:               Appears normal, left   Bladder:                Appears normal                         sided ---------------------------------------------------------------------- Cervix Uterus Adnexa  Cervix  Not visualized (advanced GA >24wks) ---------------------------------------------------------------------- Comments  This patient  presented to the MAU due to frequent  contractions.  She is currently pregnant with dichorionic,  diamniotic twins.  A limited ultrasound performed today shows that twin A is in  the breech presentation and twin B is in the transverse  presentation.  There was normal amniotic fluid noted around both twin A  and twin B. ----------------------------------------------------------------------                   Ma Rings, MD Electronically Signed Final Report   07/10/2020 02:54 pm ----------------------------------------------------------------------    Medications:  Scheduled  docusate sodium  100 mg Oral Daily   prenatal multivitamin  1 tablet Oral Q1200   I have reviewed the patient's current medications.  ASSESSMENT: G2P1001 [redacted]w[redacted]d Estimated Date of Delivery: 08/11/20  Patient Active Problem List   Diagnosis Date Noted   Threatened preterm labor, third trimester 07/10/2020   Short interval between pregnancies complicating pregnancy, antepartum, unspecified trimester 02/27/2020   Alpha thalassemia silent carrier 02/05/2020   Dichorionic diamniotic twin gestation 01/23/2020   Supervision of high risk pregnancy, antepartum 12/24/2019   Acne vulgaris 01/11/2019   Adjustment disorder with anxiety 07/10/2015   ADHD (attention deficit hyperactivity disorder) 12/05/2012    PLAN: >Continue in house  observation due to advanced cervical change in an unstable--->transverse lie did di twin pregnancy >Consider delivery at 36-37 weeks if remains undelivered,s/p BMZ >Assess presentation prior to delivery mode plan >S/P transfusion 3 units PRBC  Meriel Kelliher H Leesa Leifheit 07/12/2020,8:28 AM

## 2020-07-13 ENCOUNTER — Inpatient Hospital Stay (HOSPITAL_COMMUNITY): Payer: Medicaid Other | Admitting: Certified Registered"

## 2020-07-13 ENCOUNTER — Encounter (HOSPITAL_COMMUNITY)
Admission: AD | Disposition: A | Discharge status: Home / Self Care | Payer: Self-pay | Source: Home / Self Care | Attending: Obstetrics and Gynecology

## 2020-07-13 DIAGNOSIS — O10013 Pre-existing essential hypertension complicating pregnancy, third trimester: Secondary | ICD-10-CM

## 2020-07-13 DIAGNOSIS — D649 Anemia, unspecified: Secondary | ICD-10-CM

## 2020-07-13 LAB — TYPE AND SCREEN
ABO/RH(D): O POS
Antibody Screen: NEGATIVE
Unit division: 0
Unit division: 0
Unit division: 0
Unit division: 0
Unit division: 0

## 2020-07-13 LAB — BPAM RBC
Blood Product Expiration Date: 202201182359
Blood Product Expiration Date: 202201182359
Blood Product Expiration Date: 202201182359
Blood Product Expiration Date: 202201232359
Blood Product Expiration Date: 202201232359
ISSUE DATE / TIME: 202112181548
ISSUE DATE / TIME: 202112182015
ISSUE DATE / TIME: 202112190121
Unit Type and Rh: 5100
Unit Type and Rh: 5100
Unit Type and Rh: 5100
Unit Type and Rh: 5100
Unit Type and Rh: 5100

## 2020-07-13 SURGERY — Surgical Case
Anesthesia: Spinal

## 2020-07-13 MED ORDER — MORPHINE SULFATE (PF) 0.5 MG/ML IJ SOLN
INTRAMUSCULAR | Status: DC | PRN
  Administered 2020-07-13: 150 ug via EPIDURAL

## 2020-07-13 MED ORDER — MORPHINE SULFATE (PF) 0.5 MG/ML IJ SOLN
INTRAMUSCULAR | Status: AC
  Filled 2020-07-13: qty 10

## 2020-07-13 MED ORDER — SOD CITRATE-CITRIC ACID 500-334 MG/5ML PO SOLN: 30.0000 mL | Freq: Once | ORAL | Status: AC

## 2020-07-13 MED ORDER — LACTATED RINGERS IV SOLN: INTRAVENOUS | Status: DC | PRN

## 2020-07-13 MED ORDER — PHENYLEPHRINE HCL-NACL 20-0.9 MG/250ML-% IV SOLN
INTRAVENOUS | Status: DC | PRN
  Administered 2020-07-13: 60 ug/min via INTRAVENOUS

## 2020-07-13 MED ORDER — TRANEXAMIC ACID-NACL 1000-0.7 MG/100ML-% IV SOLN
INTRAVENOUS | Status: DC | PRN
  Administered 2020-07-13: 1000 mg via INTRAVENOUS

## 2020-07-13 MED ORDER — SOD CITRATE-CITRIC ACID 500-334 MG/5ML PO SOLN
ORAL | Status: AC
  Administered 2020-07-13: 23:00:00 30 mL via ORAL
  Filled 2020-07-13: qty 15

## 2020-07-13 MED ORDER — FENTANYL CITRATE (PF) 100 MCG/2ML IJ SOLN
INTRAMUSCULAR | Status: DC | PRN
  Administered 2020-07-13: 15 ug via INTRAVENOUS

## 2020-07-13 MED ORDER — BUPIVACAINE IN DEXTROSE 0.75-8.25 % IT SOLN
INTRATHECAL | Status: DC | PRN
  Administered 2020-07-13: 1.7 mL via INTRATHECAL

## 2020-07-13 MED ORDER — CEFAZOLIN SODIUM-DEXTROSE 2-4 GM/100ML-% IV SOLN
2.0000 g | Freq: Three times a day (TID) | INTRAVENOUS | Status: DC
  Administered 2020-07-13: 2 g via INTRAVENOUS
  Filled 2020-07-13 (×2): qty 100

## 2020-07-13 MED ORDER — FENTANYL CITRATE (PF) 100 MCG/2ML IJ SOLN
INTRAMUSCULAR | Status: AC
  Filled 2020-07-13: qty 2

## 2020-07-13 SURGICAL SUPPLY — 49 items
ADH SKN CLS APL DERMABOND .7 (GAUZE/BANDAGES/DRESSINGS) ×2
APL SKNCLS STERI-STRIP NONHPOA (GAUZE/BANDAGES/DRESSINGS) ×1
BENZOIN TINCTURE PRP APPL 2/3 (GAUZE/BANDAGES/DRESSINGS) ×3 IMPLANT
CHLORAPREP W/TINT 26ML (MISCELLANEOUS) ×3 IMPLANT
CLAMP CORD UMBIL (MISCELLANEOUS) IMPLANT
CLOSURE WOUND 1/2 X4 (GAUZE/BANDAGES/DRESSINGS) ×1
CLOTH BEACON ORANGE TIMEOUT ST (SAFETY) ×3 IMPLANT
DERMABOND ADVANCED (GAUZE/BANDAGES/DRESSINGS) ×4
DERMABOND ADVANCED .7 DNX12 (GAUZE/BANDAGES/DRESSINGS) IMPLANT
DRAPE C SECTION CLR SCREEN (DRAPES) IMPLANT
DRSG OPSITE POSTOP 4X10 (GAUZE/BANDAGES/DRESSINGS) ×3 IMPLANT
ELECT REM PT RETURN 9FT ADLT (ELECTROSURGICAL) ×3
ELECTRODE REM PT RTRN 9FT ADLT (ELECTROSURGICAL) ×1 IMPLANT
EXTRACTOR VACUUM M CUP 4 TUBE (SUCTIONS) IMPLANT
EXTRACTOR VACUUM M CUP 4' TUBE (SUCTIONS)
GAUZE SPONGE 4X4 12PLY STRL LF (GAUZE/BANDAGES/DRESSINGS) ×4 IMPLANT
GLOVE BIO SURGEON STRL SZ7.5 (GLOVE) ×3 IMPLANT
GLOVE BIOGEL PI IND STRL 7.0 (GLOVE) ×1 IMPLANT
GLOVE BIOGEL PI INDICATOR 7.0 (GLOVE) ×2
GOWN STRL REUS W/TWL 2XL LVL3 (GOWN DISPOSABLE) ×3 IMPLANT
GOWN STRL REUS W/TWL LRG LVL3 (GOWN DISPOSABLE) ×6 IMPLANT
KIT ABG SYR 3ML LUER SLIP (SYRINGE) IMPLANT
NDL HYPO 25X5/8 SAFETYGLIDE (NEEDLE) IMPLANT
NEEDLE HYPO 22GX1.5 SAFETY (NEEDLE) ×3 IMPLANT
NEEDLE HYPO 25X5/8 SAFETYGLIDE (NEEDLE) IMPLANT
NS IRRIG 1000ML POUR BTL (IV SOLUTION) ×3 IMPLANT
PACK C SECTION WH (CUSTOM PROCEDURE TRAY) ×3 IMPLANT
PAD ABD 7.5X8 STRL (GAUZE/BANDAGES/DRESSINGS) ×2 IMPLANT
PAD OB MATERNITY 4.3X12.25 (PERSONAL CARE ITEMS) ×3 IMPLANT
PENCIL SMOKE EVAC W/HOLSTER (ELECTROSURGICAL) ×3 IMPLANT
RTRCTR C-SECT PINK 25CM LRG (MISCELLANEOUS) ×3 IMPLANT
STRIP CLOSURE SKIN 1/2X4 (GAUZE/BANDAGES/DRESSINGS) ×2 IMPLANT
SUT CHROMIC 1 CTX 36 (SUTURE) ×6 IMPLANT
SUT MNCRL 0 VIOLET CTX 36 (SUTURE) IMPLANT
SUT MONOCRYL 0 CTX 36 (SUTURE) ×3
SUT PLAIN 0 NONE (SUTURE) IMPLANT
SUT VIC AB 1 CT1 36 (SUTURE) ×3 IMPLANT
SUT VIC AB 2-0 CT1 (SUTURE) ×3 IMPLANT
SUT VIC AB 2-0 CT1 27 (SUTURE) ×3
SUT VIC AB 2-0 CT1 TAPERPNT 27 (SUTURE) ×1 IMPLANT
SUT VIC AB 3-0 CT1 27 (SUTURE) ×3
SUT VIC AB 3-0 CT1 TAPERPNT 27 (SUTURE) ×1 IMPLANT
SUT VIC AB 3-0 SH 27 (SUTURE)
SUT VIC AB 3-0 SH 27X BRD (SUTURE) IMPLANT
SUT VIC AB 4-0 KS 27 (SUTURE) ×3 IMPLANT
SYR BULB IRRIGATION 50ML (SYRINGE) IMPLANT
TOWEL OR 17X24 6PK STRL BLUE (TOWEL DISPOSABLE) ×3 IMPLANT
TRAY FOLEY W/BAG SLVR 14FR LF (SET/KITS/TRAYS/PACK) ×3 IMPLANT
WATER STERILE IRR 1000ML POUR (IV SOLUTION) ×3 IMPLANT

## 2020-07-13 NOTE — Anesthesia Preprocedure Evaluation (Signed)
Anesthesia Evaluation  Patient identified by MRN, date of birth, ID band Patient awake    Reviewed: Allergy & Precautions, H&P , NPO status , Patient's Chart, lab work & pertinent test results  Airway Mallampati: III  TM Distance: >3 FB Neck ROM: Full    Dental no notable dental hx.    Pulmonary asthma , former smoker,    breath sounds clear to auscultation       Cardiovascular hypertension, negative cardio ROS Normal cardiovascular exam Rhythm:Regular Rate:Normal     Neuro/Psych PSYCHIATRIC DISORDERS negative neurological ROS     GI/Hepatic negative GI ROS, Neg liver ROS,   Endo/Other  negative endocrine ROS  Renal/GU negative Renal ROS  negative genitourinary   Musculoskeletal negative musculoskeletal ROS (+)   Abdominal (+) + obese,   Peds negative pediatric ROS (+)  Hematology negative hematology ROS (+) anemia ,   Anesthesia Other Findings   Reproductive/Obstetrics negative OB ROS                             Anesthesia Physical Anesthesia Plan  ASA: III  Anesthesia Plan: Spinal   Post-op Pain Management:    Induction:   PONV Risk Score and Plan: 2  Airway Management Planned:   Additional Equipment:   Intra-op Plan:   Post-operative Plan:   Informed Consent: I have reviewed the patients History and Physical, chart, labs and discussed the procedure including the risks, benefits and alternatives for the proposed anesthesia with the patient or authorized representative who has indicated his/her understanding and acceptance.       Plan Discussed with: CRNA and Anesthesiologist  Anesthesia Plan Comments: (Patient ate at 10pm. Surgeon deems surgery cannot await appropriate NPO status due to progressive dilation. Spinal. Anemia s/p 3 u pRBCs. Will send repeat T&S and set-up more PRBCs. GETA as backup plan.)        Anesthesia Quick Evaluation

## 2020-07-13 NOTE — Progress Notes (Signed)
Patient ID: Raven Medina, female   DOB: 05-02-2001, 19 y.o.   MRN: [id number] ACULTY PRACTICE ANTEPARTUM COMPREHENSIVE PROGRESS NOTE  Raven Medina is a 19 y.o. G2P1001 at [redacted]w[redacted]d  who is admitted for Preterm labor with advance cervical dilation in setting of Di/Di twins  Fetal presentation is unstable. Length of Stay:  3  Days  Subjective: No complaints this morning Patient reports good fetal movement.  She reports no uterine contractions, no bleeding and no loss of fluid per vagina.  Vitals:  Blood pressure 128/74, pulse 84, temperature 98.1 F (36.7 C), temperature source Oral, resp. rate 16, last menstrual period 10/24/2019, SpO2 98 %, not currently breastfeeding.   Physical Examination: Lungs clear Heart RRR Abd soft + BS gravid Ext non tender  Fetal Monitoring:  130-150's x 2, + Accels x 2, no decles, no ut ctx  Labs:  No results found for this or any previous visit (from the past 24 hour(s)).  Imaging Studies:    NA   Medications:  Scheduled . docusate sodium  100 mg Oral Daily  . prenatal multivitamin  1 tablet Oral Q1200   I have reviewed the patient's current medications.  ASSESSMENT: IUP 35 6/7 weeks PTL with advance cervical dilation, S/P BMZ Di/Di twins with unstable lie Chronic anemia, s/p transfusion 3 units PRBC's  PLAN: Stable Continue routine antenatal care.   Hermina Staggers 07/13/2020,12:08 PM

## 2020-07-13 NOTE — Progress Notes (Signed)
Raven Medina is a 19 y.o. G2P1001 at [redacted]w[redacted]d by  admitted for PTL/twins  Subjective:   Objective: BP 128/84 (BP Location: Left Arm)   Pulse 94   Temp 98.3 F (36.8 C) (Oral)   Resp 16   LMP 10/24/2019 (LMP Unknown)   SpO2 99%  No intake/output data recorded. No intake/output data recorded.  FHT:   UC:   regular, every 3 minutes SVE:   Dilation: 6.5 Effacement (%): 50 Station: Ballotable Exam by:: Charon Smedberg  Labs: Lab Results  Component Value Date   WBC 8.6 07/12/2020   HGB 8.4 (L) 07/12/2020   HCT 28.2 (L) 07/12/2020   MCV 72.7 (L) 07/12/2020   PLT 175 07/12/2020    Assessment / Plan: PTL with twins, 6-7 cm BBOW, twin A breech  Proceed with primary Caesarean section    Lazaro Arms 07/13/2020, 10:56 PM

## 2020-07-13 NOTE — Anesthesia Procedure Notes (Signed)
Spinal  Patient location during procedure: OR Start time: 07/13/2020 11:40 PM End time: 07/13/2020 11:44 PM Staffing Performed: anesthesiologist  Anesthesiologist: Mellody Dance, MD Preanesthetic Checklist Completed: patient identified, IV checked, risks and benefits discussed, surgical consent, monitors and equipment checked, pre-op evaluation and timeout performed Spinal Block Patient position: sitting Prep: DuraPrep and site prepped and draped Patient monitoring: continuous pulse ox, blood pressure and heart rate Approach: midline Location: L3-4 Injection technique: single-shot Needle Needle type: Pencan  Needle gauge: 24 G Needle length: 9 cm Additional Notes Functioning IV was confirmed and monitors were applied. Sterile prep and drape, including hand hygiene and sterile gloves were used. The patient was positioned and the spine was prepped. The skin was anesthetized with lidocaine.  Free flow of clear CSF was obtained prior to injecting local anesthetic into the CSF. The needle was carefully withdrawn. The patient tolerated the procedure well.

## 2020-07-14 ENCOUNTER — Encounter (HOSPITAL_COMMUNITY): Payer: Self-pay | Admitting: Obstetrics & Gynecology

## 2020-07-14 ENCOUNTER — Inpatient Hospital Stay (HOSPITAL_COMMUNITY): Payer: Medicaid Other

## 2020-07-14 DIAGNOSIS — O321XX2 Maternal care for breech presentation, fetus 2: Secondary | ICD-10-CM

## 2020-07-14 LAB — CBC
HCT: 28.9 % — ABNORMAL LOW (ref 36.0–46.0)
HCT: 35.2 % — ABNORMAL LOW (ref 36.0–46.0)
Hemoglobin: 8.4 g/dL — ABNORMAL LOW (ref 12.0–15.0)
Hemoglobin: 11.1 g/dL — ABNORMAL LOW (ref 12.0–15.0)
MCH: 22.2 pg — ABNORMAL LOW (ref 26.0–34.0)
MCH: 23.6 pg — ABNORMAL LOW (ref 26.0–34.0)
MCHC: 29.1 g/dL — ABNORMAL LOW (ref 30.0–36.0)
MCHC: 31.5 g/dL (ref 30.0–36.0)
MCV: 76.3 fL — ABNORMAL LOW (ref 80.0–100.0)
MCV: 74.7 fL — ABNORMAL LOW (ref 80.0–100.0)
Platelets: 172 10*3/uL (ref 150–400)
Platelets: 170 10*3/uL (ref 150–400)
RBC: 3.79 MIL/uL — ABNORMAL LOW (ref 3.87–5.11)
RBC: 4.71 MIL/uL (ref 3.87–5.11)
RDW: 22.1 % — ABNORMAL HIGH (ref 11.5–15.5)
RDW: 22.4 % — ABNORMAL HIGH (ref 11.5–15.5)
WBC: 9.5 10*3/uL (ref 4.0–10.5)
WBC: 14.4 10*3/uL — ABNORMAL HIGH (ref 4.0–10.5)
nRBC: 3.1 % — ABNORMAL HIGH (ref 0.0–0.2)
nRBC: 1.2 % — ABNORMAL HIGH (ref 0.0–0.2)

## 2020-07-14 LAB — RESP PANEL BY RT-PCR (FLU A&B, COVID) ARPGX2
Influenza A by PCR: NEGATIVE
Influenza B by PCR: NEGATIVE
SARS Coronavirus 2 by RT PCR: NEGATIVE

## 2020-07-14 LAB — CREATININE, SERUM
Creatinine, Ser: 0.68 mg/dL (ref 0.44–1.00)
GFR, Estimated: >60 mL/min (ref >60)

## 2020-07-14 MED ORDER — DIPHENHYDRAMINE HCL 25 MG PO CAPS: 25.0000 mg | ORAL_CAPSULE | ORAL | Status: DC | PRN

## 2020-07-14 MED ORDER — FUROSEMIDE 10 MG/ML IJ SOLN
20.0000 mg | Freq: Once | INTRAMUSCULAR | Status: AC
  Administered 2020-07-14: 10:00:00 20 mg via INTRAVENOUS
  Filled 2020-07-14: qty 2

## 2020-07-14 MED ORDER — NALBUPHINE HCL 10 MG/ML IJ SOLN
5.0000 mg | INTRAMUSCULAR | Status: DC | PRN
Start: 2020-07-14 — End: 2020-07-17

## 2020-07-14 MED ORDER — FENTANYL CITRATE (PF) 100 MCG/2ML IJ SOLN
INTRAMUSCULAR | Status: DC | PRN
  Administered 2020-07-14: 35 ug via INTRAVENOUS
  Administered 2020-07-14: 50 ug via INTRAVENOUS

## 2020-07-14 MED ORDER — OXYTOCIN-SODIUM CHLORIDE 30-0.9 UT/500ML-% IV SOLN: 2.5000 [IU]/h | INTRAVENOUS | Status: AC

## 2020-07-14 MED ORDER — METHYLERGONOVINE MALEATE 0.2 MG/ML IJ SOLN
INTRAMUSCULAR | Status: DC | PRN
  Administered 2020-07-14: .2 mg via INTRAMUSCULAR

## 2020-07-14 MED ORDER — SODIUM CHLORIDE 0.9 % IV SOLN: INTRAVENOUS | Status: DC | PRN

## 2020-07-14 MED ORDER — SCOPOLAMINE 1 MG/3DAYS TD PT72
MEDICATED_PATCH | TRANSDERMAL | Status: DC | PRN
  Administered 2020-07-14: 1 via TRANSDERMAL

## 2020-07-14 MED ORDER — COCONUT OIL OIL: 1.0000 "application " | TOPICAL_OIL | Status: DC | PRN

## 2020-07-14 MED ORDER — NALOXONE HCL 0.4 MG/ML IJ SOLN: 0.4000 mg | INTRAMUSCULAR | Status: DC | PRN

## 2020-07-14 MED ORDER — SODIUM CHLORIDE 0.9% FLUSH
3.0000 mL | INTRAVENOUS | Status: DC | PRN
Start: 2020-07-14 — End: 2020-07-17

## 2020-07-14 MED ORDER — SCOPOLAMINE 1 MG/3DAYS TD PT72
MEDICATED_PATCH | TRANSDERMAL | Status: AC
  Filled 2020-07-14: qty 1

## 2020-07-14 MED ORDER — LACTATED RINGERS IV SOLN: INTRAVENOUS | Status: DC

## 2020-07-14 MED ORDER — STERILE WATER FOR IRRIGATION IR SOLN
Status: DC | PRN
  Administered 2020-07-14: 1000 mL

## 2020-07-14 MED ORDER — SCOPOLAMINE 1 MG/3DAYS TD PT72: 1.0000 | MEDICATED_PATCH | Freq: Once | TRANSDERMAL | Status: DC

## 2020-07-14 MED ORDER — METHYLERGONOVINE MALEATE 0.2 MG/ML IJ SOLN
INTRAMUSCULAR | Status: AC
  Filled 2020-07-14: qty 1

## 2020-07-14 MED ORDER — NALOXONE HCL 4 MG/10ML IJ SOLN
1.0000 ug/kg/h | INTRAVENOUS | Status: DC | PRN
  Filled 2020-07-14: qty 5

## 2020-07-14 MED ORDER — ALBUTEROL SULFATE HFA 108 (90 BASE) MCG/ACT IN AERS
INHALATION_SPRAY | RESPIRATORY_TRACT | Status: DC | PRN
  Administered 2020-07-14: 4 via RESPIRATORY_TRACT

## 2020-07-14 MED ORDER — ACETAMINOPHEN 500 MG PO TABS
1000.0000 mg | ORAL_TABLET | Freq: Three times a day (TID) | ORAL | Status: DC
  Administered 2020-07-14: 09:00:00 1000 mg via ORAL

## 2020-07-14 MED ORDER — OXYTOCIN-SODIUM CHLORIDE 30-0.9 UT/500ML-% IV SOLN
INTRAVENOUS | Status: DC | PRN
  Administered 2020-07-14 (×2): 30 [IU] via INTRAVENOUS

## 2020-07-14 MED ORDER — ALBUTEROL SULFATE HFA 108 (90 BASE) MCG/ACT IN AERS
INHALATION_SPRAY | RESPIRATORY_TRACT | Status: AC
  Filled 2020-07-14: qty 6.7

## 2020-07-14 MED ORDER — ONDANSETRON HCL 4 MG/2ML IJ SOLN: 4.0000 mg | Freq: Three times a day (TID) | INTRAMUSCULAR | Status: DC | PRN

## 2020-07-14 MED ORDER — NALBUPHINE HCL 10 MG/ML IJ SOLN: 5.0000 mg | Freq: Once | INTRAMUSCULAR | Status: DC | PRN

## 2020-07-14 MED ORDER — WITCH HAZEL-GLYCERIN EX PADS: 1.0000 "application " | MEDICATED_PAD | CUTANEOUS | Status: DC | PRN

## 2020-07-14 MED ORDER — KETOROLAC TROMETHAMINE 30 MG/ML IJ SOLN: 30.0000 mg | Freq: Four times a day (QID) | INTRAMUSCULAR | Status: AC | PRN

## 2020-07-14 MED ORDER — OXYTOCIN-SODIUM CHLORIDE 30-0.9 UT/500ML-% IV SOLN
INTRAVENOUS | Status: AC
  Filled 2020-07-14: qty 500

## 2020-07-14 MED ORDER — SENNOSIDES-DOCUSATE SODIUM 8.6-50 MG PO TABS
2.0000 | ORAL_TABLET | ORAL | Status: DC
  Administered 2020-07-14 – 2020-07-15 (×2): 2 via ORAL
  Filled 2020-07-14 (×4): qty 2

## 2020-07-14 MED ORDER — SIMETHICONE 80 MG PO CHEW
80.0000 mg | CHEWABLE_TABLET | Freq: Three times a day (TID) | ORAL | Status: DC
  Administered 2020-07-14 – 2020-07-15 (×4): 80 mg via ORAL
  Filled 2020-07-14 (×10): qty 1

## 2020-07-14 MED ORDER — KETOROLAC TROMETHAMINE 30 MG/ML IJ SOLN
30.0000 mg | Freq: Four times a day (QID) | INTRAMUSCULAR | Status: AC
  Administered 2020-07-14 (×2): 30 mg via INTRAVENOUS
  Filled 2020-07-14 (×2): qty 1

## 2020-07-14 MED ORDER — ENOXAPARIN SODIUM 40 MG/0.4ML ~~LOC~~ SOLN: 40.0000 mg | SUBCUTANEOUS | Status: DC

## 2020-07-14 MED ORDER — FUROSEMIDE 10 MG/ML IJ SOLN
INTRAMUSCULAR | Status: AC
  Filled 2020-07-14: qty 2

## 2020-07-14 MED ORDER — SODIUM CHLORIDE 0.9 % IR SOLN
Status: DC | PRN
  Administered 2020-07-14: 1

## 2020-07-14 MED ORDER — DIBUCAINE (PERIANAL) 1 % EX OINT: 1.0000 "application " | TOPICAL_OINTMENT | CUTANEOUS | Status: DC | PRN

## 2020-07-14 MED ORDER — ACETAMINOPHEN 500 MG PO TABS
1000.0000 mg | ORAL_TABLET | Freq: Four times a day (QID) | ORAL | Status: DC
  Filled 2020-07-14: qty 2

## 2020-07-14 MED ORDER — DIPHENHYDRAMINE HCL 25 MG PO CAPS: 25.0000 mg | ORAL_CAPSULE | Freq: Four times a day (QID) | ORAL | Status: DC | PRN

## 2020-07-14 MED ORDER — KETOROLAC TROMETHAMINE 30 MG/ML IJ SOLN
30.0000 mg | Freq: Four times a day (QID) | INTRAMUSCULAR | Status: DC
  Administered 2020-07-14: 06:00:00 30 mg via INTRAVENOUS
  Filled 2020-07-14: qty 1

## 2020-07-14 MED ORDER — NALBUPHINE HCL 10 MG/ML IJ SOLN
5.0000 mg | INTRAMUSCULAR | Status: DC | PRN
Start: 2020-07-14 — End: 2020-07-17
  Administered 2020-07-14: 5 mg via INTRAVENOUS
  Filled 2020-07-14: qty 1

## 2020-07-14 MED ORDER — DEXAMETHASONE SODIUM PHOSPHATE 10 MG/ML IJ SOLN
INTRAMUSCULAR | Status: DC | PRN
  Administered 2020-07-14: 10 mg via INTRAVENOUS

## 2020-07-14 MED ORDER — METOCLOPRAMIDE HCL 5 MG/ML IJ SOLN
INTRAMUSCULAR | Status: DC | PRN
  Administered 2020-07-14: 10 mg via INTRAVENOUS

## 2020-07-14 MED ORDER — IBUPROFEN 800 MG PO TABS
800.0000 mg | ORAL_TABLET | Freq: Three times a day (TID) | ORAL | Status: DC
  Administered 2020-07-15 – 2020-07-17 (×8): 800 mg via ORAL
  Filled 2020-07-14 (×8): qty 1

## 2020-07-14 MED ORDER — ONDANSETRON HCL 4 MG/2ML IJ SOLN
INTRAMUSCULAR | Status: DC | PRN
  Administered 2020-07-14: 4 mg via INTRAVENOUS

## 2020-07-14 MED ORDER — DIPHENHYDRAMINE HCL 50 MG/ML IJ SOLN
12.5000 mg | INTRAMUSCULAR | Status: DC | PRN
  Administered 2020-07-14: 06:00:00 12.5 mg via INTRAVENOUS
  Filled 2020-07-14: qty 1

## 2020-07-14 MED ORDER — METHYLERGONOVINE MALEATE 0.2 MG/ML IJ SOLN
0.2000 mg | INTRAMUSCULAR | Status: AC
  Administered 2020-07-14: 04:00:00 0.2 mg via INTRAMUSCULAR

## 2020-07-14 MED ORDER — ENOXAPARIN SODIUM 60 MG/0.6ML ~~LOC~~ SOLN
50.0000 mg | SUBCUTANEOUS | Status: DC
  Administered 2020-07-14 – 2020-07-16 (×3): 50 mg via SUBCUTANEOUS
  Filled 2020-07-14 (×3): qty 0.6

## 2020-07-14 MED ORDER — PRENATAL MULTIVITAMIN CH: 1.0000 | ORAL_TABLET | Freq: Every day | ORAL | Status: DC

## 2020-07-14 MED ORDER — TETANUS-DIPHTH-ACELL PERTUSSIS 5-2.5-18.5 LF-MCG/0.5 IM SUSY: 0.5000 mL | PREFILLED_SYRINGE | Freq: Once | INTRAMUSCULAR | Status: DC

## 2020-07-14 MED ORDER — OXYCODONE HCL 5 MG PO TABS
5.0000 mg | ORAL_TABLET | ORAL | Status: DC | PRN
  Administered 2020-07-15: 5 mg via ORAL
  Administered 2020-07-15: 10 mg via ORAL
  Administered 2020-07-16 (×2): 5 mg via ORAL
  Administered 2020-07-16: 10 mg via ORAL
  Administered 2020-07-17: 5 mg via ORAL
  Filled 2020-07-14: qty 2
  Filled 2020-07-14: qty 1
  Filled 2020-07-14: qty 2
  Filled 2020-07-14 (×3): qty 1

## 2020-07-14 MED ORDER — FUROSEMIDE 10 MG/ML IJ SOLN
20.0000 mg | Freq: Once | INTRAMUSCULAR | Status: AC
  Administered 2020-07-14: 04:00:00 20 mg via INTRAVENOUS

## 2020-07-14 MED ORDER — MENTHOL 3 MG MT LOZG: 1.0000 | LOZENGE | OROMUCOSAL | Status: DC | PRN

## 2020-07-14 MED ORDER — SIMETHICONE 80 MG PO CHEW: 80.0000 mg | CHEWABLE_TABLET | ORAL | Status: DC | PRN

## 2020-07-14 MED ORDER — IBUPROFEN 800 MG PO TABS: 800.0000 mg | ORAL_TABLET | Freq: Four times a day (QID) | ORAL | Status: DC

## 2020-07-14 MED ORDER — HYDROMORPHONE HCL 1 MG/ML IJ SOLN: 1.0000 mg | INTRAMUSCULAR | Status: DC | PRN

## 2020-07-14 NOTE — Anesthesia Postprocedure Evaluation (Addendum)
Anesthesia Post Note  Patient: Raven Medina  Procedure(s) Performed: CESAREAN SECTION (N/A )     Patient location during evaluation: PACU Anesthesia Type: Spinal Level of consciousness: oriented and awake and alert Pain management: pain level controlled Vital Signs Assessment: post-procedure vital signs reviewed and stable Respiratory status: spontaneous breathing and patient connected to nasal cannula oxygen Cardiovascular status: blood pressure returned to baseline and stable Postop Assessment: no headache, no backache, no apparent nausea or vomiting and patient able to bend at knees Anesthetic complications: no Comments: O2 sats persistently in upper 80s/low 90s after aggressive pulmonary toilet (coughing, incentive spirometry, and albuterol inhaler at 0330 (see intraop record)). Getting portable CXR and will assess to see if low dose lasix will help given 5u PRBCs over last several days. Patient completely asymptomatic. Tanna Furry, MD   07/14/20 [phone] Urine responded well to 20mg  IV Lasix. Patient completely asymptomatic with O2 sat of 88-92% on 2-3L [place]. Will plan to monitor on OB specialty care instead of Mother/Baby. Aggressive pulmonary toilet should continue postoperatively to ameliorate any atelectasis. , MD      No complications documented.  Last Vitals:  Vitals:   07/14/20 0320 07/14/20 0324  BP:    Pulse: 71 65  Resp: (!) 21 20  Temp: 37.1 C   SpO2: 90% 91%    Last Pain:  Vitals:   07/14/20 0320  TempSrc: Oral  PainSc:                  07/16/20

## 2020-07-14 NOTE — Discharge Summary (Addendum)
Postpartum Discharge Summary      Patient Name: Raven Medina DOB: 09-Aug-2000 MRN: [id number]  Date of admission: 07/10/2020 Delivery date:   Promise, Weldin [[id number]]  07/14/2020    Dajuana, Palen [[id number]]  07/14/2020   Delivering provider:    Jacolyn Reedy Penina [[id number]]  MYKALA, MCCREADY, GirlB Joella [[id number]]  Tania Ade H   Date of discharge: 07/17/2020  Admitting diagnosis: Threatened preterm labor, third trimester [O47.03] Intrauterine pregnancy: [redacted]w[redacted]d     Secondary diagnosis:  Principal Problem:   Threatened preterm labor, third trimester Active Problems:   Supervision of high risk pregnancy, antepartum   Dichorionic diamniotic twin gestation  Additional problems: Postpartum Cesarean Delivery     Discharge diagnosis: Preterm Pregnancy Delivered and Anemia                                              Post partum procedures:blood transfusion Augmentation: N/A Complications: None  Hospital course: Onset of Labor With Unplanned C/S   19 y.o. yo G2P1001 at [redacted]w[redacted]d was admitted in Latent Labor on 07/10/2020. Patient had advanced cervical dilation and progressed to 7 cm on 12/20 . The patient went for cesarean section due to preterm labor and breech presenation . Delivery details as follows: Membrane Rupture Time/Date:    Ocie, Stanzione [[id number]]  12:08 AM    Olga, Bourbeau [[id number]]  12:08 AM  ,   Shuronda, Santino [[id number]]  07/14/2020    Selam, Pietsch [[id number]]  07/14/2020    Delivery Method:   Shanika, Levings [[id number]]  C-Section, Low Transverse    Rosabel, Sermeno Agar [[id number]]  C-Section, Low Transverse   Details of operation can be found in separate operative note. Patient had an uncomplicated postpartum course.  She is ambulating,tolerating a regular diet, passing flatus, and urinating well.  Patient is discharged home in stable condition  07/17/20.  Newborn Data: Birth date:   Gurbani, Figge [[id number]]  07/14/2020    Shequila, Neglia [[id number]]  07/14/2020   Birth time:   Merle, Whitehorn [[id number]]  12:09 AM    Fantasy, Donald [[id number]]  12:11 AM   Gender:   Corynne, Scibilia [[id number]]  Female    Kimble, Delaurentis [place] [[id number]]  Female   Living status:   Maddilyn, Campus [[id number]]  HOZYYQ    MGNOIBBC, WUGQB [place] [[id number]]  Living   Apgars:   Isel, Skufca [[id number]]  [address] [place] [[id number]]  5  ,   [place], Delsignore [place] [[id number]]  [address] [place] [[id number]]  8   Weight:   Wanita, Derenzo Leidi [[id number]]  0712 g    Jasmynn, Pfalzgraf [[id number]]  2185 g    Magnesium Sulfate received: No BMZ received: Yes Rhophylac:N/A MMR:N/A T-DaP:Given prenatally Flu: Yes Transfusion:Yes  Received 2 units prior to C section and received 2 units post operatively  Physical exam  Vitals:   07/16/20 0522 07/16/20 1415 07/16/20 2100 07/17/20 0625  BP: 95/82 116/78 126/76 132/78  Pulse: 67 68 79 (!) 57  Resp: $Remo'18 18 18 16  'BzyGL$ Temp: 98.2 F (36.8 C) 98.1 F (36.7 C) 98.5 F (36.9 C) 98.4 F (36.9 C)  TempSrc: Oral Oral Oral Oral  SpO2: 98%     Weight:      Height:  General: alert, cooperative and no distress Lochia: appropriate Uterine Fundus: firm Incision: Healing well with no significant drainage, No significant erythema, Dressing is dry and intact with scant amount of dried serosanguineous fluid on bottom edge of honeycomb dressing DVT Evaluation: No evidence of DVT seen on physical exam. Negative Homan's sign. No cords or calf tenderness. No significant calf/ankle edema. Labs: Lab Results  Component Value Date   WBC 10.5 07/15/2020   HGB 9.8 (L) 07/15/2020   HCT 32.7 (L) 07/15/2020   MCV 75.9 (L) 07/15/2020   PLT 158 07/15/2020   CMP Latest Ref Rng &  Units 07/15/2020  Glucose 70 - 99 mg/dL 80  BUN 6 - 20 mg/dL 8  Creatinine 0.44 - 1.00 mg/dL 0.55  Sodium 135 - 145 mmol/L 135  Potassium 3.5 - 5.1 mmol/L 3.3(L)  Chloride 98 - 111 mmol/L 102  CO2 22 - 32 mmol/L 24  Calcium 8.9 - 10.3 mg/dL 8.1(L)  Total Protein 6.5 - 8.1 g/dL -  Total Bilirubin 0.3 - 1.2 mg/dL -  Alkaline Phos 38 - 126 U/L -  AST 15 - 41 U/L -  ALT 0 - 44 U/L -   Edinburgh Score: Edinburgh Postnatal Depression Scale Screening Tool 07/16/2020  I have been able to laugh and see the funny side of things. (No Data)  I have looked forward with enjoyment to things. -  I have blamed myself unnecessarily when things went wrong. -  I have been anxious or worried for no good reason. -  I have felt scared or panicky for no good reason. -  Things have been getting on top of me. -  I have been so unhappy that I have had difficulty sleeping. -  I have felt sad or miserable. -  I have been so unhappy that I have been crying. -  The thought of harming myself has occurred to me. Flavia Shipper Postnatal Depression Scale Total -     After visit meds:  Allergies as of 07/17/2020   No Known Allergies     Medication List    STOP taking these medications   [place] these medications   folic acid 1 MG tablet Commonly known as: FOLVITE Take 1 tablet (1 mg total) by mouth daily.   Gojji Weight Scale Misc 1 Device by Does not apply route daily as needed. To weight self daily as needed at home. ICD-10 code: O09.90   ibuprofen 800 MG tablet Commonly known as: ADVIL Take 1 tablet (800 mg total) by mouth every 8 (eight) hours as needed for mild pain, moderate pain or cramping.   oxyCODONE 5 MG immediate release tablet Commonly known as: Oxy IR/ROXICODONE Take 1 tablet (5 mg total) by mouth every 4 (four) hours as needed for severe pain or breakthrough pain.   Prenatal Plus/Iron 27-1 MG Tabs Take 1 tablet by mouth daily.         Discharge home in stable condition Infant Feeding: Bottle and Breast Infant Disposition:home with mother Discharge instruction: per After Visit Summary and Postpartum booklet. Activity: Advance as tolerated. Pelvic rest for 6 weeks.  Diet: routine diet   Future Appointments: Future Appointments  Date Time Provider [place]  07/23/2020  1:30 PM [place] [facility]  08/12/2020  1:15 PM Tresea Mall, CNM [facility]   Follow up Visit:  [place] [facility] [place] at [facility]. Go  on 07/23/2020.   Specialty: Obstetrics and Gynecology Why: For cesarean incision check at 1:30 PM with nurse and for postpartum visit on 08/12/2020 at 1:15 PM with Marcille Buffy, CNM Contact information: [place] [address] [phone]               Please schedule this patient for a In person postpartum visit in 4 week with the following provider: MD. Additional Postpartum F/U:Incision check 1 week  High risk pregnancy complicated by: Kathi Der twins, short interval pregnancy Delivery mode:     Rekisha, Welling [[id number]]  C-Section, Low Transverse    Natalynn, Pedone [[id number]]  C-Section, Low Transverse   Anticipated Birth Control:  Considering Phexxi (to be Rx'd at [facility] visit)   07/17/2020 Laury Deep, CNM

## 2020-07-14 NOTE — Lactation Note (Signed)
This note was copied from a baby's chart. Lactation Consultation Note  Patient Name: Raven Medina MEQAS'T Date: 07/14/2020 Reason for consult: Initial assessment;Infant < 6lbs;Multiple gestation;Late-preterm 34-36.6wks  LC in to visit with P3 mom of LPT twins at 27 hrs old.  Both babies are <6 lbs and have been to breast twice.  NT helping with spoon feeding colostrum to baby A as Baby B is in CN for low temp.    Reviewed breast massage and hand expression.  Mom's milk looks like transitional milk and milk sprays easily.  Baby spoon fed 5 ml well.   Set up DEBP and assisted Mom with first pumping.  Mom expressed 30 ml on initiation setting.  Milk placed in refrigerator for next feeding.   Mom not feeling well, nausea, itching and pain.  RN aware.  RN to obtain breast milk labels.  Plan- 1- Keep babies STS as much as possible 2- Watch babies for cues and offer breast (limit to 30 mins) 3- supplement babies with 5-10 ml per LPTI guidelines, increasing volume daily.  Supplement with spoon changing to slow flow bottle 4- Pump both breasts for 15-30 mins adding hand expression to collect as much EBM to feed to babies.  Mom aware of donor breast milk that is available if she is unable to provide enough EBM for babies.    Mom taught to disassemble pump parts, wash, rinse and air dry in separate bin provided.  Mom has a DEBP Medela at home.   LPTI guidelines shared along with lactation brochure.  Mom very committed to breastfeeding.  First baby is 42 months old and didn't latch, so she pumped for a month.  Mom desires to breastfeed or pump and bottle for longer with the twins.   Interventions Interventions: Breast feeding basics reviewed;Skin to skin;Breast massage;Hand express;DEBP  Lactation Tools Discussed/Used Tools: Pump;Flanges Flange Size: 24 Breast pump type: Double-Electric Breast Pump WIC Program: No Pump Review: Setup, frequency, and cleaning;Milk Storage Initiated  by:: Erby Pian RN IBCLC Date initiated:: 07/14/20   Consult Status Consult Status: Follow-up Date: 07/15/20 Follow-up type: In-patient    Raven Medina 07/14/2020, 8:38 AM

## 2020-07-14 NOTE — Op Note (Signed)
Operative Note   SURGERY DATE: 07/14/2020  PRE-OP DIAGNOSIS:  *Pregnancy at [redacted]w[redacted]d * Di-Di Twins * Breech Presentation * Preterm Labor   POST-OP DIAGNOSIS: Same    PROCEDURE: primary low transverse cesarean section via pfannenstiel skin incision with double layer uterine closure  SURGEON: Surgeon(s) and Role:    * Eure, Amaryllis Dyke, MD - Primary    * Alexes Lamarque, Arlana Pouch, MD - Fellow  ANESTHESIA: spinal  ESTIMATED BLOOD LOSS: 726  DRAINS: 24mL UOP via indwelling foley  TOTAL IV FLUIDS: 2000 mL crystalloid, 1 unit pRBC   VTE PROPHYLAXIS: SCDs to bilateral lower extremities  ANTIBIOTICS: Two grams of Cefazolin were given., within 1 hour of skin incision  SPECIMENS: two placenta to pathology  COMPLICATIONS: none   INDICATIONS: as above, fetal malpresentation, preterm labor   FINDINGS: No intra-abdominal adhesions were noted. Grossly normal uterus, tubes and ovaries. clear amniotic fluid, Twin A Breech presentation,  female infant, weight pending, APGARs 8/9, intact placenta. Twin B, breech presentation; female infant, weight pending, APGARS 5 and 8, intact placenta.   PROCEDURE IN DETAIL: The patient was taken to the operating room where anesthesia was administered and normal fetal heart tones were confirmed. She was then prepped and draped in the normal fashion in the dorsal supine position with a leftward tilt.  After a time out was performed, a pfannensteil  skin incision was made with the scalpel and carried through to the underlying layer of fascia. The fascia was then incised at the midline and this incision was extended laterally bluntly. The rectus muscles were then separated in the midline and the peritoneum was entered bluntly. The bladder blade was inserted.  A low transverse hysterotomy was made with the scalpel until the endometrial cavity was breached and the amniotic sac ruptured, yielding clear amniotic fluid. This incision was extended bluntly and Twin A was delivered  using breech maneuvers atraumatically.The cord was clamped x 2 and cut, and the infant was handed to the awaiting pediatricians without delay. Twin B was then delivered from breech position atraumatically. Cord gases were obtained - Twin A 7.315 and Twin B 7.334. The placentas were then both gradually expressed from the uterus and then the uterus was exteriorized and cleared of all clots and debris. The hysterotomy was repaired with a running suture of 1-0 Monocryl. A second imbricating layer of 1-0 Monocryl suture was then placed.     The uterus and adnexa were then returned to the abdomen, and the hysterotomy and all operative sites were reinspected and excellent hemostasis was noted after irrigation and suction of the abdomen with warm saline.  The peritoneum was closed with a running stitch of 1-0 chromic. The fascia was reapproximated with 0 Vicryl in a simple running fashion bilaterally. The skin was then closed with 4-0 monocryl, in a subcuticular fashion.  The patient  tolerated the procedure well. Sponge, lap, needle, and instrument counts were correct x 2. The patient was transferred to the recovery room awake, alert and breathing independently in stable condition.   Casper Harrison, MD [facility] Fellow, [facility] for [facility], [facility]

## 2020-07-14 NOTE — Transfer of Care (Signed)
Immediate Anesthesia Transfer of Care Note  Patient: Raven Medina  Procedure(s) Performed: CESAREAN SECTION (N/A )  Patient Location: PACU  Anesthesia Type:Spinal  Level of Consciousness: awake, alert  and oriented  Airway & Oxygen Therapy: Patient Spontanous Breathing and Patient connected to nasal cannula oxygen  Post-op Assessment: Report given to RN, Post -op Vital signs reviewed and stable and 02 sats 89% upon arrival to PACU, Donavan Foil MD aware, pt placed on [place] - Sats 92%. Awake, alert, lung sounds clear. Pt encouraged to take deep breaths   Post vital signs: Reviewed and stable  Last Vitals:  Vitals Value Taken Time  BP 144/95 07/14/20 0147  Temp 37.9 C 07/14/20 0147  Pulse 70 07/14/20 0151  Resp 17 07/14/20 0151  SpO2 92 % 07/14/20 0151  Vitals shown include unvalidated device data.  Last Pain:  Vitals:   07/14/20 0147  TempSrc: Oral  PainSc:          Complications: No complications documented.

## 2020-07-14 NOTE — Progress Notes (Signed)
Chaplain met pt at her bedside.  Raven Medina was nursing baby [name] and reported she was having difficulty.  Baby Elder Negus is in the NICU having difficulty regulating her temperature. [name] reports she feels overwhelmed and that she has good support, but it's difficult letting other people help her. Chaplain normalized those feelings and provided encouragement and listening presence. [name] also has a 51 month old daughter at home. [name]'s partner called while I was in the room. I helped her reach the phone and then excused myself. Spiritual care will continue to follow.  Please page as further needs arise.  Donald Prose. [name], M.Div. [facility] Chaplain Pager [phone] Office [phone]

## 2020-07-15 ENCOUNTER — Inpatient Hospital Stay: Payer: Medicaid Other | Admitting: Family Medicine

## 2020-07-15 ENCOUNTER — Ambulatory Visit: Payer: Medicaid Other

## 2020-07-15 LAB — CBC
HCT: 32.7 % — ABNORMAL LOW (ref 36.0–46.0)
Hemoglobin: 9.8 g/dL — ABNORMAL LOW (ref 12.0–15.0)
MCH: 22.7 pg — ABNORMAL LOW (ref 26.0–34.0)
MCHC: 30 g/dL (ref 30.0–36.0)
MCV: 75.9 fL — ABNORMAL LOW (ref 80.0–100.0)
Platelets: 158 10*3/uL (ref 150–400)
RBC: 4.31 MIL/uL (ref 3.87–5.11)
RDW: 22.6 % — ABNORMAL HIGH (ref 11.5–15.5)
WBC: 10.5 10*3/uL (ref 4.0–10.5)
nRBC: 0 % (ref 0.0–0.2)

## 2020-07-15 LAB — TYPE AND SCREEN
ABO/RH(D): O POS
Antibody Screen: NEGATIVE
Unit division: 0
Unit division: 0

## 2020-07-15 LAB — BASIC METABOLIC PANEL
Anion gap: 9 (ref 5–15)
BUN: 8 mg/dL (ref 6–20)
CO2: 24 mmol/L (ref 22–32)
Calcium: 8.1 mg/dL — ABNORMAL LOW (ref 8.9–10.3)
Chloride: 102 mmol/L (ref 98–111)
Creatinine, Ser: 0.55 mg/dL (ref 0.44–1.00)
GFR, Estimated: >60 mL/min (ref >60)
Glucose, Bld: 80 mg/dL (ref 70–99)
Potassium: 3.3 mmol/L — ABNORMAL LOW (ref 3.5–5.1)
Sodium: 135 mmol/L (ref 135–145)

## 2020-07-15 LAB — BPAM RBC
Blood Product Expiration Date: 202201232359
Blood Product Expiration Date: 202201232359
ISSUE DATE / TIME: 202112210024
ISSUE DATE / TIME: 202112210024
Unit Type and Rh: 5100
Unit Type and Rh: 5100

## 2020-07-15 LAB — SURGICAL PATHOLOGY

## 2020-07-15 LAB — PATHOLOGIST SMEAR REVIEW: Path Review: INCREASED

## 2020-07-15 NOTE — Progress Notes (Signed)
Subjective: Postpartum Day 1: Cesarean Delivery Patient reports incisional pain and tolerating PO.    Objective: Vital signs in last 24 hours: Temp:  [97.9 F (36.6 C)-100.5 F (38.1 C)] 97.9 F (36.6 C) (12/22 0516) Pulse Rate:  [65-104] 65 (12/22 0516) Resp:  [15-24] 18 (12/22 0516) BP: (119-137)/(66-96) 134/96 (12/22 0516) SpO2:  [92 %-99 %] 96 % (12/22 0516)  Physical Exam:  General: alert and no distress Lochia: appropriate Uterine Fundus: firm Incision: no significant drainage DVT Evaluation: No evidence of DVT seen on physical exam.  Recent Labs    07/14/20 1225 07/15/20 0528  HGB 11.1* 9.8*  HCT 35.2* 32.7*    Assessment/Plan: Status post Cesarean section. Doing well postoperatively.  Continue current care.  Wynelle Bourgeois 07/15/2020, 7:56 AM

## 2020-07-15 NOTE — Lactation Note (Signed)
This note was copied from a baby's chart. Lactation Consultation Note  Patient Name: Raven Medina [id number] Date: 07/15/2020 Reason for consult: Follow-up assessment;Late-preterm 34-36.6wks P3, 41 hour  multiple gestation LPTI ( twins). Mom's hx: closed spaced pregnancies, multiple gestation, and C/S delivery.  Mom breastfed her [age] year old daughter for 2 months and had oversupply was able to pump 8 ounces at each feeding.  Per mom, breast feeding is going, she is working on infant's latching at the breast and supplementing them with formula. LC did not observe latch at this time, Per mom, infant last feed at 3 pm, they were asleep in mom's bed laying on their backs, while mom was sitting in a chair. Time of feeding : 1500 Per mom,  Baby A -[name] did not latch but was given 17 mls of Similac Neosure 22 kcal with iron.  Per mom, Baby B- [name] latched at the breast for 5 minutes and was supplemented with 12 mls of Similac Neosure 22 kcal with iron.  Mom knows to BF according to LPTI feeding polices, green sheet was given to mom. Per mom, she has not been pumping as previously advised, she pumped maybe twice today. LC reviewed the importance of using the DEBP to help stimulate and establish her milk supply and to give twins back any milk that she pumped first before supplementing them with formula. Per mom, she notice her supply is different with the twins than will her first daughter, LC explained this is normal and that every birth experience is different.   LC reviewed hand expression and mom expressed 8 mls of colostrum and placed 4 mls in two bottles, she will offer this to twins after latching infant at the breast. Mom's plan: 1. Follow LPTI guidelines green sheet given, latch twins at breast first, will given infant's her EBM first and then supplement them with 22kcal formula based on infant's age/ hours of life Day 2 ( 10- to 20 mls). 2. After latching twins, dad will  supplement them with EBM and formula while mom is using the DEBP.  3. Mom knows to call RN or LC if she needs assistance with latching twins at the birth. Mom plans to latch each infant individually for now.   Maternal Data    Feeding    LATCH Score                   Interventions Interventions: Hand express;DEBP  Lactation Tools Discussed/Used     Consult Status Consult Status: Follow-up Date: 07/16/20 Follow-up type: In-patient    Danelle Earthly 07/15/2020, 5:18 PM

## 2020-07-15 NOTE — Lactation Note (Signed)
This note was copied from a baby's chart. Lactation Consultation Note Mom states baby isn't latching well to the breast. Mom requested formula. Mom let BM sit out in room all day w/o refrigerating it. Has been over 8 hrs. Reviewed milk storage. Encouraged mom to put BM in refrigerator  And give to babies for supplementation as needed.  LC gave baby formula w/purple nipple. Babies taken to CN d/t low temps.  Patient Name: Raven Medina [id number] Date: 07/15/2020 Reason for consult: Follow-up assessment;Late-preterm 34-36.6wks;Multiple gestation   Maternal Data    Feeding Feeding Type: Bottle Fed - Formula Nipple Type: Extra Slow Flow  LATCH Score                   Interventions Interventions: DEBP  Lactation Tools Discussed/Used     Consult Status Consult Status: Follow-up Date: 07/15/20 Follow-up type: In-patient    Charyl Dancer 07/15/2020, 1:38 AM

## 2020-07-16 LAB — TYPE AND SCREEN
ABO/RH(D): O POS
Antibody Screen: NEGATIVE

## 2020-07-16 NOTE — Progress Notes (Addendum)
3:58pm-CSW updated by CPS worker C. Pass that there are no further barrier's to infant discharging home with MOB at this time. CSW provided MOB and FOB with carseats at this time. There are no further CSW needs at this time.      12:23pm-CSW escorted CPS to bedside at this time.    CSW received call back from C. Pass with [place] on call CPS and was advised that case was assigned as an immediate response.Per C. Pass she would be to hospital to speak with MOB within 45 mins. CSW to escort CPS up once arrived.    Raven Medina, MSW, LCSW [facility] [place] [phone]  

## 2020-07-16 NOTE — Progress Notes (Signed)
Subjective: Postpartum Day 2: Cesarean Delivery Patient reports incisional pain, tolerating PO, + flatus and no problems voiding.    Objective: Vital signs in last 24 hours: Temp:  [98.2 F (36.8 C)-98.4 F (36.9 C)] 98.2 F (36.8 C) (12/23 0522) Pulse Rate:  [67-90] 67 (12/23 0522) Resp:  [16-18] 18 (12/23 0522) BP: (95-120)/(82-86) 95/82 (12/23 0522) SpO2:  [97 %-98 %] 98 % (12/23 0522)  Physical Exam:  General: alert and no distress Lochia: appropriate Uterine Fundus: firm Incision: no significant drainage DVT Evaluation: No evidence of DVT seen on physical exam.  Recent Labs    07/14/20 1225 07/15/20 0528  HGB 11.1* 9.8*  HCT 35.2* 32.7*    Assessment/Plan: Status post Cesarean section. Doing well postoperatively.  Continue current care Does not want to go home today Plan discharge tomorrow. Wynelle Bourgeois 07/16/2020, 8:51 AM

## 2020-07-16 NOTE — Progress Notes (Signed)
CSW consulted initially for anxiety and adjustment disorder. IN consult it is also noted that MOB is in need of carseats for infant as the one's order have not yet arrived. CSW also noted in chart review, it is noted that MOB has had 2 incidents of cosleepingwith infant. CSW went to speak with MOB at bedside to address further needs.   CSW entered the room and congratulated MOB on the birth of twins. MOB thanked CSW. CSW asked MOB if any one would be joining her in the  room while CSW spoke with her in which MOB expressed that there wouldn't be.CSW understanding and advised MOB of CSW's role and the reason for CSW coming to speak with her. MOB expressed that she does have a hx of anxiety and adjustment disorder all from 2016. MOB reported no current challenges with her mental health to this CSW. CSW asked MOB about other mental health diagnosis in which MOB declined having any. CSW inquired from [facility] on other needs. MOB expressed that she has purchased the carseats for infants however they have not yet arrived. MOB was advised that carseats here are $30 each in which MOB expressed the desire to purchase.   CSW inquired from [facility] on concerns for safe sleep. MOB expressed that she has been found twice co sleeping with infants. MOB expressed that she gets very comfortable some times and "forget that the baby is in the bed". CSW was also advised that per MOB "they wont sleep with me at home". MOB expressed that she has a twin double basinet that infants will sleep in once arrived home. CSW took time to review safe sleep information with MOB. CSW explained SIDS to MOB once more and reiterated the importance of infant snot sleep in in the bed with her. CSW also advised MOB that CSW would need to make CPS report due to MOB co-sleeping with infants even after being advised not to. MOB expressed that she understood and expressed no other questions regarding CPS report. MOB denied having any other CPS hx to this CSW.  CSW  took time to provide MOB with PPD education. MOB was given PPD Checklist in order to keep track of feeling as they may relate to PPD. MOB thanked CSW and reported no other needs. CSW will provide MOB with carseats once significant other arrived to the hospital.   CSW has made [facility] CPS report to on call worker C. Pass for concerns of safe sleep. CSW awaits call back from CPS worker at this time.    Claude Manges Kassi Esteve, MSW, LCSW [facility] [place] [phone]

## 2020-07-16 NOTE — Lactation Note (Addendum)
This note was copied from a baby's chart. Lactation Consultation Note  Patient Name: Raven Medina [id number] Date: 07/16/2020 Reason for consult: Follow-up assessment;Mother's request;Late-preterm 34-36.6wks;Infant weight loss (PIH) Age:20 hours   Infant is 36 weeks 36 hours old Twin A. LC reviewed SLP note following her evaluation. LC then called and spoke to Belinda Block to find out if infants are able to both breast and bottle feeding given challenges she wrote in her note.   LC then followed up with provider, Demetrios Isaacs, who stated important infant try to reach 20-30 ml per feed.   Upon arrival, Mom was bottle feeding infant A with Similac Neosure 22 cal/oz completing 15 ml before infant retired. LC not able to assess a latch as a result. LC did provide instructions for Mom to change her hold from cradle to cross cradle to provide head support and for Mom to hold the bottle which at the time the infant grasping the small bottle on her own.   Twin B  LC then attempted to latch Baby B to Mom's breast but infant not really engaging to open her mouth to latch. Mom states she was expressing colostrum on the nipple and putting the breast in her mouth to get her to suck. Mom has adequate colostrum. LC attempted a few times to try to get the infant to latch but she was not engaging. Mom bottle fed infant 15 ml. LC demonstrated with Mom how to chart feedings on the color coded log sheets for each twin. LC also suggested Mom pour out feeds in the smaller bottles from the electric pump kit to increase the accuracy of the volumes given during a feeding.   LC examined Mom's breasts and noted both nipples were erect and round with no signs of nipple trauma. Mom states she is comfortable with flange size currently.  Mom states she pumped 3 x yesterday. Mom did not pump as yet today. LC encouraged Mom to pump consistently to increase stimulation and maintain her milk supply.   Plan 1. To feed based on  guidelines provided by SLP to offer formula using the purple NFant nipples.           2. Mom to pump using her DEBP q 3 hours for 15 minutes.           3. Mom to use the color coded I and O sheet to track feedings.           4. LC reviewed findings above with RN, Lonn Georgia, on the floor and with SLP.

## 2020-07-17 DIAGNOSIS — Z98891 History of uterine scar from previous surgery: Secondary | ICD-10-CM

## 2020-07-17 MED ORDER — IBUPROFEN 800 MG PO TABS: 800.0000 mg | ORAL_TABLET | Freq: Three times a day (TID) | ORAL | 1 refills | Status: DC | PRN

## 2020-07-17 MED ORDER — OXYCODONE HCL 5 MG PO TABS: 5.0000 mg | ORAL_TABLET | ORAL | 0 refills | Status: DC | PRN

## 2020-07-17 NOTE — Lactation Note (Signed)
This note was copied from a baby's chart. Lactation Consultation Note  Patient Name: Janyia Guion [id number]'U Date: 07/17/2020 Reason for consult: Follow-up assessment;Late-preterm 34-36.6wks;Infant weight loss;Infant < 6lbs;Other (Comment) (mom plans to feed baby A after she finishes feeding Baby B) Age:20 years ( 19 years old )  Baby asleep still. Mom aware to feed by 3 hours and with feeding cues.  Per mom last fed baby at 9 am with gold nipple.    Maternal Data Has patient been taught Hand Expression?: Yes  Feeding Feeding Type:  (last fed around 9 am)  LATCH Score                   Interventions Interventions: Breast feeding basics reviewed  Lactation Tools Discussed/Used Tools: Pump;Flanges Flange Size: 24 (per mom comfortable) Breast pump type: Double-Electric Breast Pump;Manual Pump Education: Milk Storage   Consult Status Consult Status: Follow-up Date: 07/18/20 Follow-up type: In-patient    Matilde Sprang Newt Levingston 07/17/2020, 12:30 PM

## 2020-07-17 NOTE — Lactation Note (Signed)
This note was copied from a baby's chart. Lactation Consultation Note  Patient Name: Raven Medina [id number] Date: 07/17/2020 Reason for consult: Follow-up assessment;Late-preterm 34-36.6wks;Infant weight loss;Other (Comment) (6 % weight loss/ baby in the crib asleep and mom asleep too.) Age:20 days/ @77  hours old - Bili skin check -7.2 Baby A  last fed at 0911 - RN needs to update the amount. Wets and stools QS for age.  LC reviewed doc flow sheets and the volumes per feedings need to increase for hours of age.  LC not able to talk to mom due to her sleeping.  LC needs to F/U     Maternal Data    Feeding Feeding Type: Bottle Fed - Breast Milk  LATCH Score                   Interventions    Lactation Tools Discussed/Used     Consult Status Consult Status: Follow-up Date: 07/17/20 Follow-up type: In-patient    07/19/20 Valli Randol 07/17/2020, 10:29 AM

## 2020-07-17 NOTE — Lactation Note (Signed)
This note was copied from a baby's chart. Lactation Consultation Note  Patient Name: Raven Medina [id number] Date: 07/17/2020 Reason for consult: Follow-up assessment;Late-preterm 34-36.6wks;Infant < 6lbs;Infant weight loss Age:20 days / 84 hours old  Baby due to feed , LC started the feeding with formula , due to no EBM.  With Nfant nipple - purple. / baby tolerating it well . ( LC put 30 ml in the bottle) .  Baby took 10 ml from the [facility] and mom was feeding the rest of the 30 ml/ baby tolerating well.  Per mom feels more comfortable hand expressing and using the hand pump over the DEBP. LC reviewed supply and demand/ importance of extra stimulation due the babies not going to the breast consistently as of yet.  Per mom has pumped x 1 in the last 24 hours with 90 ml x 1 and has not pumped since.  LC recommended since mom does not like the DEBP to alterate the hand pump with the DEBP for post pumping. Save the milk for the next feeding. Storage of breast milk reviewed.  Per mom has a DEBP at home - Medela .  Praised mom for her efforts pumping.   Maternal Data Has patient been taught Hand Expression?: Yes  Feeding Feeding Type: Formula Nipple Type: Nfant Slow Flow (purple)  LATCH Score                   Interventions Interventions: Breast feeding basics reviewed  Lactation Tools Discussed/Used Tools: Pump Flange Size: 24 Breast pump type: Double-Electric Breast Pump Pump Education: Milk Storage   Consult Status Consult Status: Follow-up Date: 07/18/20 Follow-up type: In-patient    Matilde Sprang Oron Westrup 07/17/2020, 12:14 PM

## 2020-07-18 ENCOUNTER — Ambulatory Visit: Payer: Self-pay

## 2020-07-18 NOTE — Lactation Note (Signed)
This note was copied from a baby's chart. Lactation Consultation Note  Patient Name: Raven Medina [id number] Date: 07/18/2020 Reason for consult: Follow-up assessment   Mother reports she is unsure if she will go home today. She is just getting ready to feed Baby A. She plans to give her 30 ml of ebm. Staff nurse  changed her nipple to extra slow flow purple nipple. Infant is tolerating better.  Mother reports that both infants are doing well.  Mother is pumping with the manual pump for 15-20 mins on each breast. She estimates that she pumps 140 from each breast.   Mother reports that she plans to continue to use the hand pump and is more comfortable with the pump.  Baby B is also using the extra slow flow bottle nipple.  Mother denies any questions or concerns.   Age:19 years  Maternal Data    Feeding Feeding Type: Bottle Fed - Breast Milk Nipple Type: Extra Slow Flow  LATCH Score                   Interventions    Lactation Tools Discussed/Used     Consult Status Consult Status: Follow-up Date: 07/18/20 Follow-up type: In-patient    Stevan [facility] 07/18/2020, 10:32 AM

## 2020-07-19 ENCOUNTER — Ambulatory Visit: Payer: Self-pay

## 2020-07-19 NOTE — Lactation Note (Addendum)
This note was copied from a baby's chart. Lactation Consultation Note  Patient Name: Jennavieve Arrick DGUYQ'I Date: 07/19/2020 Reason for consult: Follow-up assessment, LPI Twins, < 5 lbs.  Age:20 days  Mother pumping 160-240 ml per session approx.  Mother has personal DEBP at home. Weight loss stabilized.  Stools yellow.   Reviewed engorgement care and monitoring voids/stools.  Baby A latched upon entering with frequent swallows.  Supplemented with 56ml of breastmilk with purple Nfant nipple. Baby B latched for 5 min, became sleep and was supplemented with breastmilk using purple Nfant nipple. Baby B slower to take volume.  Feeding Feeding Type: Bottle Fed - Breast Milk Nipple Type: Nfant Slow Flow (purple)  LATCH Score Latch: Grasps breast easily, tongue down, lips flanged, rhythmical sucking. (latched upon entering)  Audible Swallowing: A few with stimulation  Type of Nipple: Everted at rest and after stimulation  Comfort (Breast/Nipple): Soft / non-tender  Hold (Positioning): Assistance needed to correctly position infant at breast and maintain latch.  LATCH Score: 8  Interventions Interventions: Breast feeding basics reviewed;DEBP    Consult Status Consult Status: Complete Date: 07/19/20    Dahlia Byes [facility] 07/19/2020, 10:03 AM

## 2020-07-22 ENCOUNTER — Ambulatory Visit: Payer: Medicaid Other

## 2020-07-23 ENCOUNTER — Other Ambulatory Visit: Payer: Self-pay

## 2020-07-23 ENCOUNTER — Ambulatory Visit (INDEPENDENT_AMBULATORY_CARE_PROVIDER_SITE_OTHER): Payer: Medicaid Other | Admitting: General Practice

## 2020-07-23 VITALS — BP 129/78 | HR 92 | Ht 65.0 in | Wt 190.0 lb

## 2020-07-23 DIAGNOSIS — R300 Dysuria: Secondary | ICD-10-CM | POA: Diagnosis not present

## 2020-07-23 DIAGNOSIS — Z5189 Encounter for other specified aftercare: Secondary | ICD-10-CM | POA: Diagnosis not present

## 2020-07-23 LAB — POCT URINALYSIS DIP (DEVICE)
Glucose, UA: NEGATIVE mg/dL
Nitrite: NEGATIVE
Protein, ur: 100 mg/dL — AB
Specific Gravity, Urine: >=1.03 (ref 1.005–1.030)
Urobilinogen, UA: 1 mg/dL (ref 0.0–1.0)
pH: 6 (ref 5.0–8.0)

## 2020-07-23 MED ORDER — SULFAMETHOXAZOLE-TRIMETHOPRIM 800-160 MG PO TABS: 1.0000 | ORAL_TABLET | Freq: Two times a day (BID) | ORAL | 0 refills | Status: AC

## 2020-07-23 NOTE — Progress Notes (Signed)
Chart reviewed - agree with CMA/RN documentation.  ° °

## 2020-07-23 NOTE — Progress Notes (Signed)
Patient presents to office today following primary c-section on 12/21 for wound check. Reports she feels like she is doing well since delivery but started to have dysuria/vaginal pain since yesterday during urination and after. UA collected and suspicious for UTI- per Dr Adrian Blackwater patient can have Bactrim DS BID x 3 days with urine culture ordered. Rx prescribed as well as urine culture & patient informed. Incision is clean, dry & intact with dermabond present- appears to be healing well. Wound care and signs & symptoms of infection reviewed with patient. Patient verbalized understanding & will follow up at pp visit on 1/19.  Chase Caller RN BSN 07/23/20

## 2020-07-25 LAB — URINE CULTURE

## 2020-08-04 ENCOUNTER — Ambulatory Visit: Payer: Medicaid Other

## 2020-08-06 ENCOUNTER — Ambulatory Visit (INDEPENDENT_AMBULATORY_CARE_PROVIDER_SITE_OTHER): Payer: Medicaid Other

## 2020-08-06 ENCOUNTER — Other Ambulatory Visit: Payer: Self-pay

## 2020-08-06 VITALS — BP 132/76 | HR 79 | Wt 189.3 lb

## 2020-08-06 DIAGNOSIS — Z5189 Encounter for other specified aftercare: Secondary | ICD-10-CM | POA: Diagnosis not present

## 2020-08-06 NOTE — Progress Notes (Unsigned)
Pt here today for wound check s/p primary c-section on 07/14/20.  Pt reports some mild pain at incision site and a she feels like there is a smell.  Observed incision- adhesive glue/tape still present on some areas of the incision which have turned brown/black color with mild odor.  Cleansed incision with saline water and removed all glue/tape present.  Incision well approximated with no edema, no erythema, and no drainage.  After cleaning off adhesive no odor present.  Observed small opening about 2-3 mm in length at the incision.  Dr. Debroah Loop assesses incision and pt informed that incision is healing nicely.  Pt advised to continue to monitor for sx's of infection and that she will be evaluated at her pp visit scheduled on 08/12/20.  Pt verbalized understanding.    Addison Naegeli, RN  08/06/20

## 2020-08-12 ENCOUNTER — Ambulatory Visit: Payer: Medicaid Other | Admitting: Student

## 2020-08-18 NOTE — Progress Notes (Signed)
Patient was assessed and managed by nursing staff during this encounter. I have reviewed the chart and agree with the documentation and plan. I have also made any necessary editorial changes.  Scheryl Darter, MD 08/18/2020 9:19 AM  Patient ID: Raven Medina, female   DOB: [birth year], 19 y.o.   MRN: [id number]

## 2020-08-19 ENCOUNTER — Telehealth: Payer: Self-pay | Admitting: Family Medicine

## 2020-08-19 NOTE — Telephone Encounter (Signed)
Left a voicemail for pt to call back to resch PP visit,

## 2020-09-21 ENCOUNTER — Other Ambulatory Visit (INDEPENDENT_AMBULATORY_CARE_PROVIDER_SITE_OTHER): Payer: Self-pay

## 2021-03-24 ENCOUNTER — Emergency Department (HOSPITAL_COMMUNITY)
Admission: EM | Admit: 2021-03-24 | Discharge: 2021-03-24 | Disposition: A | Discharge status: Home / Self Care | Payer: Medicaid Other | Attending: Emergency Medicine | Admitting: Emergency Medicine

## 2021-03-24 ENCOUNTER — Encounter (HOSPITAL_COMMUNITY): Payer: Self-pay

## 2021-03-24 ENCOUNTER — Other Ambulatory Visit: Payer: Self-pay

## 2021-03-24 DIAGNOSIS — K529 Noninfective gastroenteritis and colitis, unspecified: Secondary | ICD-10-CM | POA: Insufficient documentation

## 2021-03-24 DIAGNOSIS — N9489 Other specified conditions associated with female genital organs and menstrual cycle: Secondary | ICD-10-CM | POA: Insufficient documentation

## 2021-03-24 DIAGNOSIS — J45909 Unspecified asthma, uncomplicated: Secondary | ICD-10-CM | POA: Insufficient documentation

## 2021-03-24 DIAGNOSIS — Z20822 Contact with and (suspected) exposure to covid-19: Secondary | ICD-10-CM | POA: Insufficient documentation

## 2021-03-24 DIAGNOSIS — Z87891 Personal history of nicotine dependence: Secondary | ICD-10-CM | POA: Diagnosis not present

## 2021-03-24 DIAGNOSIS — R1013 Epigastric pain: Secondary | ICD-10-CM | POA: Diagnosis present

## 2021-03-24 LAB — URINALYSIS, ROUTINE W REFLEX MICROSCOPIC
Bilirubin Urine: NEGATIVE
Glucose, UA: NEGATIVE mg/dL
Hgb urine dipstick: NEGATIVE
Ketones, ur: NEGATIVE mg/dL
Leukocytes,Ua: NEGATIVE
Nitrite: NEGATIVE
Specific Gravity, Urine: >1.03 — ABNORMAL HIGH (ref 1.005–1.030)
pH: 6 (ref 5.0–8.0)

## 2021-03-24 LAB — SARS CORONAVIRUS 2 (TAT 6-24 HRS): SARS Coronavirus 2: NEGATIVE

## 2021-03-24 LAB — URINALYSIS, MICROSCOPIC (REFLEX)

## 2021-03-24 LAB — COMPREHENSIVE METABOLIC PANEL
ALT: 12 U/L (ref 0–44)
AST: 14 U/L — ABNORMAL LOW (ref 15–41)
Albumin: 4.1 g/dL (ref 3.5–5.0)
Alkaline Phosphatase: 93 U/L (ref 38–126)
Anion gap: 8 (ref 5–15)
BUN: 13 mg/dL (ref 6–20)
CO2: 25 mmol/L (ref 22–32)
Calcium: 9.4 mg/dL (ref 8.9–10.3)
Chloride: 107 mmol/L (ref 98–111)
Creatinine, Ser: 0.7 mg/dL (ref 0.44–1.00)
GFR, Estimated: >60 mL/min (ref >60)
Glucose, Bld: 98 mg/dL (ref 70–99)
Potassium: 3.4 mmol/L — ABNORMAL LOW (ref 3.5–5.1)
Sodium: 140 mmol/L (ref 135–145)
Total Bilirubin: 0.5 mg/dL (ref 0.3–1.2)
Total Protein: 7.6 g/dL (ref 6.5–8.1)

## 2021-03-24 LAB — CBC
HCT: 40 % (ref 36.0–46.0)
Hemoglobin: 12.5 g/dL (ref 12.0–15.0)
MCH: 26.5 pg (ref 26.0–34.0)
MCHC: 31.3 g/dL (ref 30.0–36.0)
MCV: 84.9 fL (ref 80.0–100.0)
Platelets: 210 10*3/uL (ref 150–400)
RBC: 4.71 MIL/uL (ref 3.87–5.11)
RDW: 14.2 % (ref 11.5–15.5)
WBC: 6.6 10*3/uL (ref 4.0–10.5)
nRBC: 0 % (ref 0.0–0.2)

## 2021-03-24 LAB — I-STAT BETA HCG BLOOD, ED (MC, WL, AP ONLY): I-stat hCG, quantitative: <5 m[IU]/mL (ref <5)

## 2021-03-24 LAB — LIPASE, BLOOD: Lipase: 41 U/L (ref 11–51)

## 2021-03-24 MED ORDER — ONDANSETRON 4 MG PO TBDP: 4.0000 mg | ORAL_TABLET | Freq: Three times a day (TID) | ORAL | 0 refills | Status: DC | PRN

## 2021-03-24 MED ORDER — ONDANSETRON 4 MG PO TBDP
4.0000 mg | ORAL_TABLET | Freq: Once | ORAL | Status: AC | PRN
  Administered 2021-03-24: 4 mg via ORAL
  Filled 2021-03-24: qty 1

## 2021-03-24 MED ORDER — POTASSIUM CHLORIDE CRYS ER 20 MEQ PO TBCR
20.0000 meq | EXTENDED_RELEASE_TABLET | Freq: Once | ORAL | Status: AC
  Administered 2021-03-24: 20 meq via ORAL
  Filled 2021-03-24: qty 1

## 2021-03-24 NOTE — ED Provider Notes (Signed)
[facility]-EMERGENCY DEPT Provider Note   CSN: [id number] Arrival date & time: 03/24/21  0606     History Chief Complaint  Patient presents with   Abdominal Pain   Diarrhea   Nausea   Emesis    Raven Medina is a 20 y.o. female.   Abdominal Pain Associated symptoms: diarrhea, nausea and vomiting   Associated symptoms: no chest pain, no chills, no cough, no dysuria, no fever, no hematuria, no shortness of breath and no sore throat   Diarrhea Associated symptoms: abdominal pain and vomiting   Associated symptoms: no arthralgias, no chills and no fever   Emesis Associated symptoms: abdominal pain and diarrhea   Associated symptoms: no arthralgias, no chills, no cough, no fever and no sore throat    20 year old female presenting to the emergency department with roughly 1 day of nausea, vomiting, watery diarrhea.  Patient states that symptoms darted after consuming a [employer] meal Tuesday morning.  She has had episodes of NBNB emesis ever since.  She denies any hematochezia.  She endorses crampy epigastric abdominal discomfort.  She denies any history of gallstone disease, pancreatitis, diabetes.  She denies any fevers or chills.  Past Medical History:  Diagnosis Date   ADHD (attention deficit hyperactivity disorder)    Allergy    seasonal   Asthma    intermittent   Iron deficiency anemia 04/17/2014   Obesity    Pregnancy induced hypertension    Supervision of normal first pregnancy, antepartum 02/19/2019    Nursing Staff Provider Office Location [facility] Dating  LMP Language  English Anatomy US  Normal - incomplete - FU > Normal  Flu Vaccine  Declined 06/27/2019 Genetic Screen  NIPS: LR girl   AFP:  TDaP vaccine   Declined 06/27/2019 Hgb A1C or  GTT Early 5.4 Third trimester nmL 75-117-91 Rhogam  NA   LAB RESULTS  Feeding Plan Pumping Breast/bottle Blood Type O/Positive/-- (08/05 1541)  Contracepti    Patient Active Problem List   Diagnosis Date Noted    Threatened preterm labor, third trimester 07/10/2020   Short interval between pregnancies complicating pregnancy, antepartum, unspecified trimester 02/27/2020   Alpha thalassemia silent carrier 02/05/2020   Dichorionic diamniotic twin gestation 01/23/2020   Supervision of high risk pregnancy, antepartum 12/24/2019   Acne vulgaris 01/11/2019   Adjustment disorder with anxiety 07/10/2015   ADHD (attention deficit hyperactivity disorder) 12/05/2012    Past Surgical History:  Procedure Laterality Date   CESAREAN SECTION MULTI-GESTATIONAL N/A 07/13/2020   Procedure: CESAREAN SECTION MULTI-GESTATIONAL;  Surgeon: Lazaro Arms, MD;  Location: [facility] LD ORS;  Service: Obstetrics;  Laterality: N/A;   HERNIA REPAIR     myringotomy with tubes Bilateral    at age 22   WISDOM TOOTH EXTRACTION       OB History     Gravida  2   Para  2   Term  1   Preterm  1   AB      Living  3      SAB      IAB      Ectopic      Multiple  1   Live Births  3           Family History  Problem Relation Age of Onset   Hypertension Mother    Hypertension Maternal Grandmother    Hypertension Maternal Grandfather     Social History   Tobacco Use   Smoking status: Former    Types: Software engineer  Smokeless tobacco: Never   Tobacco comments:    mother smokes inside home  Vaping Use   Vaping Use: Never used  Substance Use Topics   Alcohol use: Not Currently    Alcohol/week: 0.0 standard drinks    Comment: Social when not pregnant   Drug use: Not Currently    Types: Marijuana    Comment: Last smoked 01/08/19    Home Medications Prior to Admission medications   Medication Sig Start Date End Date Taking? Authorizing Provider  ondansetron (ZOFRAN ODT) 4 MG disintegrating tablet Take 1 tablet (4 mg total) by mouth every 8 (eight) hours as needed for nausea or vomiting. 03/24/21  Yes Ernie Avena, MD    Allergies    Patient has no known allergies.  Review of Systems   Review of  Systems  Constitutional:  Negative for chills and fever.  HENT:  Negative for ear pain and sore throat.   Eyes:  Negative for pain and visual disturbance.  Respiratory:  Negative for cough and shortness of breath.   Cardiovascular:  Negative for chest pain and palpitations.  Gastrointestinal:  Positive for abdominal pain, diarrhea, nausea and vomiting.  Genitourinary:  Negative for dysuria and hematuria.  Musculoskeletal:  Negative for arthralgias and back pain.  Skin:  Negative for color change and rash.  Neurological:  Negative for seizures and syncope.  All other systems reviewed and are negative.  Physical Exam Updated Vital Signs BP 137/85   Pulse 84   Temp 98.9 F (37.2 C) (Oral)   Resp 16   SpO2 100%   Physical Exam Vitals and nursing note reviewed.  Constitutional:      General: She is not in acute distress.    Appearance: She is well-developed.  HENT:     Head: Normocephalic and atraumatic.  Eyes:     Conjunctiva/sclera: Conjunctivae normal.  Cardiovascular:     Rate and Rhythm: Normal rate and regular rhythm.     Heart sounds: No murmur heard. Pulmonary:     Effort: Pulmonary effort is normal. No respiratory distress.     Breath sounds: Normal breath sounds.  Abdominal:     Palpations: Abdomen is soft.     Tenderness: There is abdominal tenderness in the epigastric area.     Comments: Abdomen is soft & nondistended.  No rebound or guarding.  Negative Murphy sign.  Mild epigastric tenderness.  Musculoskeletal:     Cervical back: Neck supple.  Skin:    General: Skin is warm and dry.  Neurological:     Mental Status: She is alert.    ED Results / Procedures / Treatments   Labs (all labs ordered are listed, but only abnormal results are displayed) Labs Reviewed  COMPREHENSIVE METABOLIC PANEL - Abnormal; Notable for the following components:      Result Value   Potassium 3.4 (*)    AST 14 (*)    All other components within normal limits  URINALYSIS,  ROUTINE W REFLEX MICROSCOPIC - Abnormal; Notable for the following components:   Specific Gravity, Urine >1.030 (*)    Protein, ur TRACE (*)    All other components within normal limits  URINALYSIS, MICROSCOPIC (REFLEX) - Abnormal; Notable for the following components:   Bacteria, UA RARE (*)    All other components within normal limits  SARS CORONAVIRUS 2 (TAT 6-24 HRS)  LIPASE, BLOOD  CBC  I-STAT BETA HCG BLOOD, ED ([facility], [facility], [facility] ONLY)    EKG None  Radiology No results found.  Procedures Procedures   Medications Ordered in ED Medications  ondansetron (ZOFRAN-ODT) disintegrating tablet 4 mg (4 mg Oral Given 03/24/21 0713)  potassium chloride SA (KLOR-CON) CR tablet 20 mEq (20 mEq Oral Given 03/24/21 1275)    ED Course  I have reviewed the triage vital signs and the nursing notes.  Pertinent labs & imaging results that were available during my care of the patient were reviewed by me and considered in my medical decision making (see chart for details).    MDM Rules/Calculators/A&P                           20 year old female presenting with symptoms consistent with likely gastroenteritis following suspect food intake.  The patient was provided ODT Zofran on arrival.  Vitals significant for afebrile, hemodynamically stable, not tachycardic or tachypneic, saturating well on room air.  Physical exam with mild epigastric tenderness to palpation, overall abdomen was soft and nondistended.  Negative Murphy sign.  Low suspicion for an acute intra-abdominal abnormality based on overall reassuring abdominal exam findings.  She has no fever or chills and has had NBNB emesis and no bloody diarrhea.  Screening labs obtained significant for CBC without a leukocytosis, anemia or platelet abnormality, CMP with mild hypokalemia to 3.4, repleted orally in the ED, otherwise unremarkable, lipase normal, urinalysis without evidence of UTI, urine pregnancy negative.  The patient was tolerating oral intake  following ODT Zofran.  She is overall well-appearing, well-hydrated, tolerating oral intake, with symptoms consistent with likely viral gastroenteritis or possible food poisoning.  The patient was advised symptomatic management at home with continued oral fluid rehydration, ODT Zofran for nausea.  Return precautions provided in the event of worsening symptoms.   Final Clinical Impression(s) / ED Diagnoses Final diagnoses:  Gastroenteritis    Rx / DC Orders ED Discharge Orders          Ordered    ondansetron (ZOFRAN ODT) 4 MG disintegrating tablet  Every 8 hours PRN        03/24/21 0724             Ernie Avena, MD 03/24/21 [phone]

## 2021-03-24 NOTE — ED Notes (Signed)
ED Provider at bedside. 

## 2021-03-24 NOTE — ED Triage Notes (Signed)
Pt states that she had [facility] Tuesday morning and has been vomiting ever since. Pt complains of abdominal pain and nausea.

## 2021-08-18 IMAGING — DX DG CHEST 1V PORT
1 series · 1 of 1 positions shown · non-contrast
Comparison: Radiograph radiograph 01/08/2013

CLINICAL DATA: Oxygen desaturation post C-section

EXAM:
PORTABLE CHEST 1 VIEW

[chest ap]
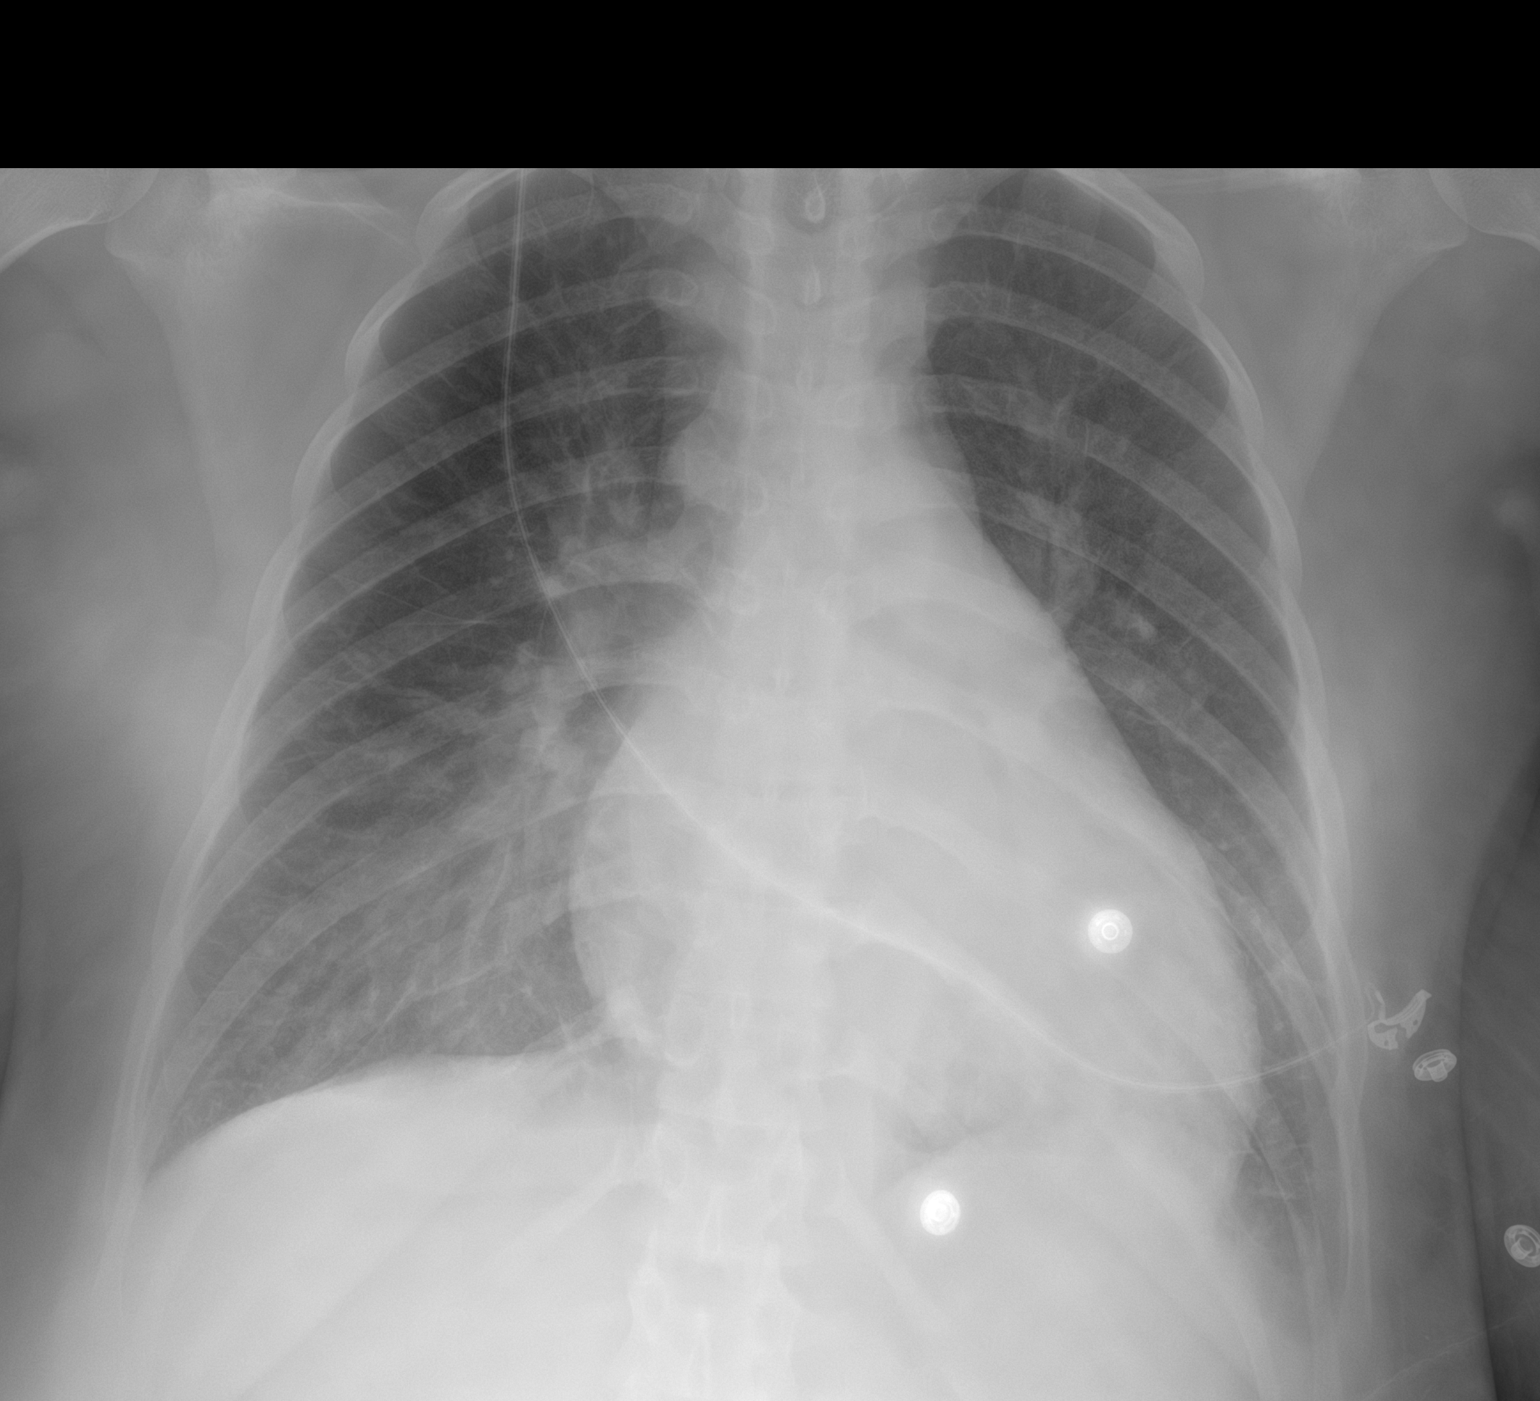

[1 of 1 positions shown; findings below may reference images not displayed]

FINDINGS: Some hazy opacity is present the mid to lower lungs with pulmonary
vascular congestion fissural and septal thickening and trace
effusions, left greater than right. Cardiac silhouette is
top-normal. No pneumothorax. No acute osseous or soft tissue
abnormality. Telemetry leads overlie the chest.
IMPRESSION: Basilar opacity, vascular congestion septal lines could reflect
edema and/or atelectasis with trace bilateral effusions.

## 2021-10-08 ENCOUNTER — Ambulatory Visit (HOSPITAL_COMMUNITY)
Admission: RE | Admit: 2021-10-08 | Discharge: 2021-10-08 | Disposition: A | Discharge status: Home / Self Care | Payer: Medicaid Other | Source: Ambulatory Visit | Attending: Emergency Medicine | Admitting: Emergency Medicine

## 2021-10-08 ENCOUNTER — Encounter (HOSPITAL_COMMUNITY): Payer: Self-pay

## 2021-10-08 ENCOUNTER — Other Ambulatory Visit: Payer: Self-pay

## 2021-10-08 VITALS — BP 134/88 | HR 82 | Temp 98.8°F | Resp 16 | Ht 65.0 in | Wt 189.4 lb

## 2021-10-08 DIAGNOSIS — R102 Pelvic and perineal pain: Secondary | ICD-10-CM | POA: Insufficient documentation

## 2021-10-08 NOTE — ED Triage Notes (Signed)
Pt reports abdominal pain x 1 week. Pt states during menstrual cycle, cramps have been more than normal. Requesting STD testing. Denies any vaginal discharge or odor.  ?

## 2021-10-08 NOTE — Discharge Instructions (Signed)
The cause of your increased abdominal cramping is not known, this may be a result of imbalance, we will watchfully wait to determine if your cycle will reset itself with the following month ? ?If bleeding does not resolve he may follow-up with urgent care for reevaluation or with the [facility] ? ?You may continue use of Tylenol for pain management ? ?Labs pending 2-3 days, you will be contacted if positive for any sti and treatment will be sent to the pharmacy, you will have to return to the clinic if positive for gonorrhea to receive treatment  ? ?Please refrain from having sex until labs results, if positive please refrain from having sex until treatment complete and symptoms resolve  ? ?If positive for Chlamydia  gonorrhea or trichomoniasis please notify partner or partners so they may tested as well ? ?Moving forward, it is recommended you use some form of protection against the transmission of sti infections  such as condoms or dental dams with each sexual encounter   ?

## 2021-10-08 NOTE — ED Provider Notes (Signed)
?[facility] ? ? ? ?CSN: [id number] ?Arrival date & time: 10/08/21  [facility] ? ? ?  ? ?History   ?Chief Complaint ?Chief Complaint  ?Patient presents with  ? Abdominal Pain  ? ? ?HPI ?Raven Medina is a 21 y.o. female.  ? ?Patient presents with increased suprapubic abdominal cramping and prolonged menses.  Symptoms have been present for 5 days.  Typically her cycle comes 3 days occurring monthly.  Endorses that bleeding is heavier than normal, changing tampon/pad every 2-3 hours but it is not bloodsoaked at that time.  Denies use of birth control.  Denies increased stress, dietary changes or increase in activity.  Symptoms have not occurred before.  Requesting STI testing.  Sexually active, 1 partner, no condom use.  Denies vaginal discharge, itching, odor, urinary frequency, urgency, flank pain ? ? ?Past Medical History:  ?Diagnosis Date  ? ADHD (attention deficit hyperactivity disorder)   ? Allergy   ? seasonal  ? Asthma   ? intermittent  ? Iron deficiency anemia 04/17/2014  ? Obesity   ? Pregnancy induced hypertension   ? Supervision of normal first pregnancy, antepartum 02/19/2019  ?  Nursing Staff Provider Office Location [facility] Dating  LMP Language  English Anatomy US  Normal - incomplete - FU > Normal  Flu Vaccine  Declined 06/27/2019 Genetic Screen  NIPS: LR girl   AFP:  TDaP vaccine   Declined 06/27/2019 Hgb A1C or  GTT Early 5.4 Third trimester nmL 75-117-91 Rhogam  NA   LAB RESULTS  Feeding Plan Pumping Breast/bottle Blood Type O/Positive/-- (08/05 1541)  Contracepti  ? ? ?Patient Active Problem List  ? Diagnosis Date Noted  ? Threatened preterm labor, third trimester 07/10/2020  ? Short interval between pregnancies complicating pregnancy, antepartum, unspecified trimester 02/27/2020  ? Alpha thalassemia silent carrier 02/05/2020  ? Dichorionic diamniotic twin gestation 01/23/2020  ? Supervision of high risk pregnancy, antepartum 12/24/2019  ? Acne vulgaris 01/11/2019  ? Adjustment disorder with  anxiety 07/10/2015  ? ADHD (attention deficit hyperactivity disorder) 12/05/2012  ? ? ?Past Surgical History:  ?Procedure Laterality Date  ? CESAREAN SECTION MULTI-GESTATIONAL N/A 07/13/2020  ? Procedure: CESAREAN SECTION MULTI-GESTATIONAL;  Surgeon: Lazaro Arms, MD;  Location: [facility] LD ORS;  Service: Obstetrics;  Laterality: N/A;  ? HERNIA REPAIR    ? myringotomy with tubes Bilateral   ? at age 67  ? WISDOM TOOTH EXTRACTION    ? ? ?OB History   ? ? Gravida  ?2  ? Para  ?2  ? Term  ?1  ? Preterm  ?1  ? AB  ?   ? Living  ?3  ?  ? ? SAB  ?   ? IAB  ?   ? Ectopic  ?   ? Multiple  ?1  ? Live Births  ?3  ?   ?  ?  ? ? ? ?Home Medications   ? ?Prior to Admission medications   ?Medication Sig Start Date End Date Taking? Authorizing Provider  ?ondansetron (ZOFRAN ODT) 4 MG disintegrating tablet Take 1 tablet (4 mg total) by mouth every 8 (eight) hours as needed for nausea or vomiting. 03/24/21   Ernie Avena, MD  ? ? ?Family History ?Family History  ?Problem Relation Age of Onset  ? Hypertension Mother   ? Hypertension Maternal Grandmother   ? Hypertension Maternal Grandfather   ? ? ?Social History ?Social History  ? ?Tobacco Use  ? Smoking status: Former  ?  Types: Cigars  ?  Smokeless tobacco: Never  ? Tobacco comments:  ?  mother smokes inside home  ?Vaping Use  ? Vaping Use: Never used  ?Substance Use Topics  ? Alcohol use: Not Currently  ?  Alcohol/week: 0.0 standard drinks  ?  Comment: Social when not pregnant  ? Drug use: Not Currently  ?  Types: Marijuana  ?  Comment: Last smoked 01/08/19  ? ? ? ?Allergies   ?Patient has no known allergies. ? ? ?Review of Systems ?Review of Systems ?Defer to HPI ? ? ?Physical Exam ?Triage Vital Signs ?ED Triage Vitals  ?Enc Vitals Group  ?   BP 10/08/21 1901 134/88  ?   Pulse Rate 10/08/21 1901 82  ?   Resp 10/08/21 1901 16  ?   Temp 10/08/21 1901 98.8 ?F (37.1 ?C)  ?   Temp Source 10/08/21 1901 Oral  ?   SpO2 10/08/21 1901 96 %  ?   Weight 10/08/21 1900 189 lb 6 oz (85.9 kg)  ?    Height 10/08/21 1900 5\' 5"  (1.651 m)  ?   Head Circumference --   ?   Peak Flow --   ?   Pain Score 10/08/21 1859 4  ?   Pain Loc --   ?   Pain Edu? --   ?   Excl. in GC? --   ? ?No data found. ? ?Updated Vital Signs ?BP 134/88 (BP Location: Right Arm)   Pulse 82   Temp 98.8 ?F (37.1 ?C) (Oral)   Resp 16   Ht 5\' 5"  (1.651 m)   Wt 189 lb 6 oz (85.9 kg)   SpO2 96%   BMI 31.51 kg/m?  ? ?Visual Acuity ?Right Eye Distance:   ?Left Eye Distance:   ?Bilateral Distance:   ? ?Right Eye Near:   ?Left Eye Near:    ?Bilateral Near:    ? ?Physical Exam ?Constitutional:   ?   Appearance: She is well-developed.  ?HENT:  ?   Head: Normocephalic.  ?Eyes:  ?   Extraocular Movements: Extraocular movements intact.  ?Pulmonary:  ?   Effort: Pulmonary effort is normal.  ?Abdominal:  ?   General: Abdomen is flat. Bowel sounds are normal.  ?   Palpations: Abdomen is soft.  ?   Tenderness: There is abdominal tenderness in the suprapubic area.  ?Skin: ?   General: Skin is warm and dry.  ?Neurological:  ?   General: No focal deficit present.  ?   Mental Status: She is alert and oriented to person, place, and time.  ?Psychiatric:     ?   Mood and Affect: Mood normal.     ?   Behavior: Behavior normal.  ? ? ? ?UC Treatments / Results  ?Labs ?(all labs ordered are listed, but only abnormal results are displayed) ?Labs Reviewed  ?CERVICOVAGINAL ANCILLARY ONLY  ? ? ?EKG ? ? ?Radiology ?No results found. ? ?Procedures ?Procedures (including critical care time) ? ?Medications Ordered in UC ?Medications - No data to display ? ?Initial Impression / Assessment and Plan / UC Course  ?I have reviewed the triage vital signs and the nursing notes. ? ?Pertinent labs & imaging results that were available during my care of the patient were reviewed by me and considered in my medical decision making (see chart for details). ? ?Acute suprapubic pain ? ?Vital signs are stable, patient is in no signs of distress, mild tendernes noted to the suprapubic  region but patient is currently menstruating, recommended  a watchful wait to determine if symptoms will resolve on their own, patient would like to continue use of over-the-counter Tylenol for management of symptoms, STI labs pending, will treat per protocol, advised abstinence until labs results and/or treatment is complete, given walking referral to gynecology ?Final diagnoses:  ?None  ? ?Discharge Instructions   ?None ?  ? ?ED Prescriptions   ?None ?  ? ?PDMP not reviewed this encounter. ?  ?Valinda HoarWhite, Vernon Maish R, NP ?10/08/21 1942 ? ?

## 2021-10-11 ENCOUNTER — Telehealth (HOSPITAL_COMMUNITY): Payer: Self-pay | Admitting: Emergency Medicine

## 2021-10-11 LAB — CERVICOVAGINAL ANCILLARY ONLY
Bacterial Vaginitis (gardnerella): POSITIVE — AB
Candida Glabrata: NEGATIVE
Candida Vaginitis: NEGATIVE
Chlamydia: NEGATIVE
Comment: NEGATIVE
Comment: NEGATIVE
Comment: NEGATIVE
Comment: NEGATIVE
Comment: NEGATIVE
Comment: NORMAL
Neisseria Gonorrhea: NEGATIVE
Trichomonas: POSITIVE — AB

## 2021-10-11 MED ORDER — METRONIDAZOLE 500 MG PO TABS: 500.0000 mg | ORAL_TABLET | Freq: Two times a day (BID) | ORAL | 0 refills | Status: DC

## 2021-10-28 ENCOUNTER — Ambulatory Visit (HOSPITAL_COMMUNITY): Payer: Medicaid Other

## 2021-10-31 ENCOUNTER — Encounter (HOSPITAL_COMMUNITY): Payer: Self-pay

## 2021-10-31 ENCOUNTER — Ambulatory Visit (HOSPITAL_COMMUNITY)
Admission: RE | Admit: 2021-10-31 | Discharge: 2021-10-31 | Disposition: A | Discharge status: Home / Self Care | Payer: Medicaid Other | Source: Ambulatory Visit | Attending: Physician Assistant | Admitting: Physician Assistant

## 2021-10-31 VITALS — BP 123/74 | HR 81 | Temp 99.2°F | Resp 17

## 2021-10-31 DIAGNOSIS — Z113 Encounter for screening for infections with a predominantly sexual mode of transmission: Secondary | ICD-10-CM | POA: Diagnosis not present

## 2021-10-31 DIAGNOSIS — Z202 Contact with and (suspected) exposure to infections with a predominantly sexual mode of transmission: Secondary | ICD-10-CM | POA: Diagnosis not present

## 2021-10-31 LAB — POC URINE PREG, ED: Preg Test, Ur: NEGATIVE

## 2021-10-31 LAB — HEPATITIS C ANTIBODY: HCV Ab: NONREACTIVE

## 2021-10-31 LAB — HIV ANTIBODY (ROUTINE TESTING W REFLEX): HIV Screen 4th Generation wRfx: NONREACTIVE

## 2021-10-31 NOTE — ED Triage Notes (Signed)
Pt presents for retesting after recently being treated for an STD a few weeks ago; pt states she is no longer having symptoms. ?

## 2021-10-31 NOTE — Discharge Instructions (Signed)
Your urine pregnancy test was negative in clinic today.  We will contact you with your swab and blood testing results.  Please monitor your MyChart for these results.  If you develop any symptoms please return for reevaluation. ?

## 2021-10-31 NOTE — ED Provider Notes (Signed)
?[facility] ? ? ? ?CSN: [id number] ?Arrival date & time: 10/31/21  1444 ? ? ?  ? ?History   ?Chief Complaint ?Chief Complaint  ?Patient presents with  ? SEXUALLY TRANSMITTED DISEASE  ?  Retest for recent positive results. - Entered by patient  ? ? ?HPI ?Noni Stonesifer is a 21 y.o. female.  ? ?Patient presents today for retesting of STIs.  She was seen on 10/08/2021 at which point he tested positive for bacterial vaginosis and trichomonas.  She was treated with metronidazole.  Reports completing treatment as prescribed without missing doses.  She did have some nausea with this medication but it was minimal and did not interfere with course of medicine.  She reports she is currently asymptomatic and denies any abdominal pain, pelvic pain, fever, nausea, vomiting, vaginal discharge, vaginal irritation.  Denies any known exposure to STI.  She is interested in full STI panel.  She does not believe that she is pregnant but is open to testing. ? ? ?Past Medical History:  ?Diagnosis Date  ? ADHD (attention deficit hyperactivity disorder)   ? Allergy   ? seasonal  ? Asthma   ? intermittent  ? Iron deficiency anemia 04/17/2014  ? Obesity   ? Pregnancy induced hypertension   ? Supervision of normal first pregnancy, antepartum 02/19/2019  ?  Nursing Staff Provider Office Location [facility] Dating  LMP Language  English Anatomy US  Normal - incomplete - FU > Normal  Flu Vaccine  Declined 06/27/2019 Genetic Screen  NIPS: LR girl   AFP:  TDaP vaccine   Declined 06/27/2019 Hgb A1C or  GTT Early 5.4 Third trimester nmL 75-117-91 Rhogam  NA   LAB RESULTS  Feeding Plan Pumping Breast/bottle Blood Type O/Positive/-- (08/05 1541)  Contracepti  ? ? ?Patient Active Problem List  ? Diagnosis Date Noted  ? Threatened preterm labor, third trimester 07/10/2020  ? Short interval between pregnancies complicating pregnancy, antepartum, unspecified trimester 02/27/2020  ? Alpha thalassemia silent carrier 02/05/2020  ? Dichorionic  diamniotic twin gestation 01/23/2020  ? Supervision of high risk pregnancy, antepartum 12/24/2019  ? Acne vulgaris 01/11/2019  ? Adjustment disorder with anxiety 07/10/2015  ? ADHD (attention deficit hyperactivity disorder) 12/05/2012  ? ? ?Past Surgical History:  ?Procedure Laterality Date  ? CESAREAN SECTION MULTI-GESTATIONAL N/A 07/13/2020  ? Procedure: CESAREAN SECTION MULTI-GESTATIONAL;  Surgeon: Lazaro Arms, MD;  Location: [facility];  Service: Obstetrics;  Laterality: N/A;  ? HERNIA REPAIR    ? myringotomy with tubes Bilateral   ? at age 10  ? WISDOM TOOTH EXTRACTION    ? ? ?OB History   ? ? Gravida  ?2  ? Para  ?2  ? Term  ?1  ? Preterm  ?1  ? AB  ?   ? Living  ?3  ?  ? ? SAB  ?   ? IAB  ?   ? Ectopic  ?   ? Multiple  ?1  ? Live Births  ?3  ?   ?  ?  ? ? ? ?Home Medications   ? ?Prior to Admission medications   ?Medication Sig Start Date End Date Taking? Authorizing Provider  ?ondansetron (ZOFRAN ODT) 4 MG disintegrating tablet Take 1 tablet (4 mg total) by mouth every 8 (eight) hours as needed for nausea or vomiting. 03/24/21   Ernie Avena, MD  ? ? ?Family History ?Family History  ?Problem Relation Age of Onset  ? Hypertension Mother   ? Hypertension Maternal Grandmother   ?  Hypertension Maternal Grandfather   ? ? ?Social History ?Social History  ? ?Tobacco Use  ? Smoking status: Former  ?  Types: Cigars  ? Smokeless tobacco: Never  ? Tobacco comments:  ?  mother smokes inside home  ?Vaping Use  ? Vaping Use: Never used  ?Substance Use Topics  ? Alcohol use: Not Currently  ?  Alcohol/week: 0.0 standard drinks  ?  Comment: Social when not pregnant  ? Drug use: Not Currently  ?  Types: Marijuana  ?  Comment: Last smoked 01/08/19  ? ? ? ?Allergies   ?Patient has no known allergies. ? ? ?Review of Systems ?Review of Systems  ?Constitutional:  Negative for activity change, appetite change, fatigue and fever.  ?Gastrointestinal:  Negative for abdominal pain, diarrhea, nausea and vomiting.  ?Genitourinary:   Negative for dysuria, flank pain, frequency, pelvic pain, urgency, vaginal bleeding, vaginal discharge and vaginal pain.  ?Neurological:  Negative for dizziness, light-headedness and headaches.  ? ? ?Physical Exam ?Triage Vital Signs ?ED Triage Vitals  ?Enc Vitals Group  ?   BP 10/31/21 1523 123/74  ?   Pulse Rate 10/31/21 1523 81  ?   Resp 10/31/21 1523 17  ?   Temp 10/31/21 1523 99.2 ?F (37.3 ?C)  ?   Temp Source 10/31/21 1523 Oral  ?   SpO2 10/31/21 1523 99 %  ?   Weight --   ?   Height --   ?   Head Circumference --   ?   Peak Flow --   ?   Pain Score 10/31/21 1524 0  ?   Pain Loc --   ?   Pain Edu? --   ?   Excl. in GC? --   ? ?No data found. ? ?Updated Vital Signs ?BP 123/74 (BP Location: Left Arm)   Pulse 81   Temp 99.2 ?F (37.3 ?C) (Oral)   Resp 17   LMP  (LMP Unknown)   SpO2 99%  ? ?Visual Acuity ?Right Eye Distance:   ?Left Eye Distance:   ?Bilateral Distance:   ? ?Right Eye Near:   ?Left Eye Near:    ?Bilateral Near:    ? ?Physical Exam ?Vitals reviewed.  ?Constitutional:   ?   General: She is awake. She is not in acute distress. ?   Appearance: Normal appearance. She is well-developed. She is not ill-appearing.  ?   Comments: Very pleasant female appears stated age in no acute distress sitting comfortably in exam room  ?HENT:  ?   Head: Normocephalic and atraumatic.  ?Cardiovascular:  ?   Rate and Rhythm: Normal rate and regular rhythm.  ?   Heart sounds: Normal heart sounds, S1 normal and S2 normal. No murmur heard. ?Pulmonary:  ?   Effort: Pulmonary effort is normal.  ?   Breath sounds: Normal breath sounds. No wheezing, rhonchi or rales.  ?   Comments: Clear to auscultation bilaterally ?Abdominal:  ?   General: Bowel sounds are normal.  ?   Palpations: Abdomen is soft.  ?   Tenderness: There is no abdominal tenderness. There is no right CVA tenderness, left CVA tenderness, guarding or rebound.  ?   Comments: Benign abdominal exam  ?Genitourinary: ?   Comments: Exam deferred ?Psychiatric:     ?    Behavior: Behavior is cooperative.  ? ? ? ?UC Treatments / Results  ?Labs ?(all labs ordered are listed, but only abnormal results are displayed) ?Labs Reviewed  ?HIV ANTIBODY (ROUTINE TESTING  W REFLEX)  ?RPR  ?HEPATITIS C ANTIBODY  ?POC URINE PREG, ED  ?CERVICOVAGINAL ANCILLARY ONLY  ? ? ?EKG ? ? ?Radiology ?No results found. ? ?Procedures ?Procedures (including critical care time) ? ?Medications Ordered in UC ?Medications - No data to display ? ?Initial Impression / Assessment and Plan / UC Course  ?I have reviewed the triage vital signs and the nursing notes. ? ?Pertinent labs & imaging results that were available during my care of the patient were reviewed by me and considered in my medical decision making (see chart for details). ? ?  ? ?Urine pregnancy test was negative in clinic today.  We typically wait 4 weeks for test of cure but given patient is here today we will go ahead and do repeat testing.  HIV/hepatitis/syphilis testing was also added.  We will contact patient with results if they require any treatment.  She is to monitor MyChart for additional results.  Discussed that if she develops any pain she is to return for reevaluation. ? ?Final Clinical Impressions(s) / UC Diagnoses  ? ?Final diagnoses:  ?Routine screening for STI (sexually transmitted infection)  ? ? ? ?Discharge Instructions   ? ?  ?Your urine pregnancy test was negative in clinic today.  We will contact you with your swab and blood testing results.  Please monitor your MyChart for these results.  If you develop any symptoms please return for reevaluation. ? ? ? ? ?ED Prescriptions   ?None ?  ? ?PDMP not reviewed this encounter. ?  ?Jeani HawkingRaspet, Christop Hippert K, PA-C ?10/31/21 1555 ? ?

## 2021-11-01 LAB — CERVICOVAGINAL ANCILLARY ONLY
Bacterial Vaginitis (gardnerella): NEGATIVE
Candida Glabrata: NEGATIVE
Candida Vaginitis: NEGATIVE
Chlamydia: NEGATIVE
Comment: NEGATIVE
Comment: NEGATIVE
Comment: NEGATIVE
Comment: NEGATIVE
Comment: NEGATIVE
Comment: NORMAL
Neisseria Gonorrhea: NEGATIVE
Trichomonas: NEGATIVE

## 2021-11-01 LAB — RPR: RPR Ser Ql: NONREACTIVE

## 2021-12-27 ENCOUNTER — Ambulatory Visit (HOSPITAL_COMMUNITY)
Admission: EM | Admit: 2021-12-27 | Discharge: 2021-12-27 | Disposition: A | Discharge status: Home / Self Care | Payer: Medicaid Other | Attending: Sports Medicine | Admitting: Sports Medicine

## 2021-12-27 ENCOUNTER — Encounter (HOSPITAL_COMMUNITY): Payer: Self-pay

## 2021-12-27 DIAGNOSIS — H60391 Other infective otitis externa, right ear: Secondary | ICD-10-CM

## 2021-12-27 DIAGNOSIS — J069 Acute upper respiratory infection, unspecified: Secondary | ICD-10-CM | POA: Diagnosis not present

## 2021-12-27 MED ORDER — OFLOXACIN 0.3 % OT SOLN: 5.0000 [drp] | Freq: Two times a day (BID) | OTIC | 0 refills | Status: AC

## 2021-12-27 NOTE — ED Provider Notes (Signed)
[facility]    CSN: [id number] Arrival date & time: 12/27/21  1116      History   Chief Complaint Chief Complaint  Patient presents with   Ear Drainage    Entered by patient    HPI Raven Medina is a 21 y.o. female here for right ear pain and drainage.   Ear Drainage Pertinent negatives include no chest pain and no headaches.   Had some soreness in the ear before Felt popping sensation in ear 2 days ago Somewhat muffled hearing, right side only Pain got worse after popping Had a small amount of blood/darker red wax on cotton swab Had T-tubes as child Does not use Q-tips  Cold symptoms x 3-4 days, but this is starting to improve Runny and stuff nose   Past Medical History:  Diagnosis Date   ADHD (attention deficit hyperactivity disorder)    Allergy    seasonal   Asthma    intermittent   Iron deficiency anemia 04/17/2014   Obesity    Pregnancy induced hypertension    Supervision of normal first pregnancy, antepartum 02/19/2019    Nursing Staff Provider Office Location [facility] Dating  LMP Language  English Anatomy US  Normal - incomplete - FU > Normal  Flu Vaccine  Declined 06/27/2019 Genetic Screen  NIPS: LR girl   AFP:  TDaP vaccine   Declined 06/27/2019 Hgb A1C or  GTT Early 5.4 Third trimester nmL 75-117-91 Rhogam  NA   LAB RESULTS  Feeding Plan Pumping Breast/bottle Blood Type O/Positive/-- (08/05 1541)  Contracepti    Patient Active Problem List   Diagnosis Date Noted   Threatened preterm labor, third trimester 07/10/2020   Short interval between pregnancies complicating pregnancy, antepartum, unspecified trimester 02/27/2020   Alpha thalassemia silent carrier 02/05/2020   Dichorionic diamniotic twin gestation 01/23/2020   Supervision of high risk pregnancy, antepartum 12/24/2019   Acne vulgaris 01/11/2019   Adjustment disorder with anxiety 07/10/2015   ADHD (attention deficit hyperactivity disorder) 12/05/2012    Past Surgical History:   Procedure Laterality Date   CESAREAN SECTION MULTI-GESTATIONAL N/A 07/13/2020   Procedure: CESAREAN SECTION MULTI-GESTATIONAL;  Surgeon: Lazaro ArmsEure, Luther H, MD;  Location: [facility];  Service: Obstetrics;  Laterality: N/A;   HERNIA REPAIR     myringotomy with tubes Bilateral    at age 89   WISDOM TOOTH EXTRACTION      OB History     Gravida  2   Para  2   Term  1   Preterm  1   AB      Living  3      SAB      IAB      Ectopic      Multiple  1   Live Births  3            Home Medications    Prior to Admission medications   Medication Sig Start Date End Date Taking? Authorizing Provider  ofloxacin (FLOXIN) 0.3 % OTIC solution Place 5 drops into the right ear 2 (two) times daily for 7 days. 12/27/21 01/03/22 Yes Madelyn BrunnerBrooks, Adaley Kiene, DO    Family History Family History  Problem Relation Age of Onset   Hypertension Mother    Hypertension Maternal Grandmother    Hypertension Maternal Grandfather     Social History Social History   Tobacco Use   Smoking status: Former    Types: Cigars   Smokeless tobacco: Never   Tobacco comments:    mother  smokes inside home  Vaping Use   Vaping Use: Never used  Substance Use Topics   Alcohol use: Not Currently    Alcohol/week: 0.0 standard drinks    Comment: Social when not pregnant   Drug use: Not Currently    Types: Marijuana    Comment: Last smoked 01/08/19     Allergies   Patient has no known allergies.   Review of Systems Review of Systems  Constitutional:  Negative for chills and fever.  HENT:  Positive for congestion, ear discharge, ear pain and sinus pressure. Negative for sore throat.   Respiratory:  Positive for wheezing (occasional).   Cardiovascular:  Negative for chest pain and palpitations.  Skin:  Negative for rash.  Neurological:  Negative for dizziness and headaches.    Physical Exam Triage Vital Signs ED Triage Vitals  Enc Vitals Group     BP 12/27/21 1319 (!) 132/95     Pulse Rate  12/27/21 1319 65     Resp 12/27/21 1319 16     Temp 12/27/21 1319 98.3 F (36.8 C)     Temp Source 12/27/21 1319 Oral     SpO2 12/27/21 1319 97 %     Weight 12/27/21 1317 230 lb (104.3 kg)     Height 12/27/21 1317 5\' 5"  (1.651 m)     Head Circumference --      Peak Flow --      Pain Score 12/27/21 1317 2     Pain Loc --      Pain Edu? --      Excl. in GC? --    No data found.  Updated Vital Signs BP (!) 132/95 (BP Location: Right Arm)   Pulse 65   Temp 98.3 F (36.8 C) (Oral)   Resp 16   Ht 5\' 5"  (1.651 m)   Wt 104.3 kg   LMP 12/27/2021 (Exact Date)   SpO2 97%   BMI 38.27 kg/m    Physical Exam Constitutional:      General: She is not in acute distress.    Appearance: Normal appearance. She is not ill-appearing or toxic-appearing.  HENT:     Head: Normocephalic and atraumatic.     Left Ear: Tympanic membrane, ear canal and external ear normal.     Ears:     Comments: Right ear: Erythematous canal, there is blood in slightly yellowish fluid within the ear canal.  The external auditory canal appears erythematous and infectious with some exudates.  No bulging of the TM.    Mouth/Throat:     Mouth: Mucous membranes are moist.     Pharynx: No oropharyngeal exudate.  Eyes:     Extraocular Movements: Extraocular movements intact.     Conjunctiva/sclera: Conjunctivae normal.     Pupils: Pupils are equal, round, and reactive to light.  Cardiovascular:     Rate and Rhythm: Normal rate.     Pulses: Normal pulses.  Pulmonary:     Effort: Pulmonary effort is normal.     Breath sounds: No wheezing, rhonchi or rales.  Abdominal:     General: Abdomen is flat.     Palpations: Abdomen is soft.  Neurological:     General: No focal deficit present.     Mental Status: She is alert.  Psychiatric:        Mood and Affect: Mood normal.        Thought Content: Thought content normal.     UC Treatments / Results  Labs (all labs ordered  are listed, but only abnormal results are  displayed) Labs Reviewed - No data to display  EKG   Radiology No results found.  Procedures Procedures (including critical care time)  Medications Ordered in UC Medications - No data to display  Initial Impression / Assessment and Plan / UC Course  I have reviewed the triage vital signs and the nursing notes.  Pertinent labs & imaging results that were available during my care of the patient were reviewed by me and considered in my medical decision making (see chart for details).     Patient presents with viral URI x3-4 days that she has been managing just fine at home with conservative care, however about 2 days ago she felt a "pop" in the ear and has been having some drainage and pain of the right ear.  History of tympanostomy tubes as a child but nothing recently.  Examination of the right ear does demonstrate a otitis externa that is acutely infected.  We will treat with ofloxacin drops 5 drops twice daily for the next 7 days.  Supportive treatment discussed.  Recommend following up with PCP or back here if this is not improving over the next few days to week. Safe for discharge home. Continue supportive care for viral illness. Final Clinical Impressions(s) / UC Diagnoses   Final diagnoses:  Infective otitis externa of right ear  Viral URI     Discharge Instructions      You have an outer ear infection  -We will treat this with ofloxacin eardrops, apply 5 drops into the right ear let it sit for 5 minutes.  Do this twice a day for 7 days. -Cleaning out the ears or put anything into the ear -You may want to put a cotton ball in the ear to avoid water getting in while treated -You may take ibuprofen, Motrin or Tylenol for any pain control     ED Prescriptions     Medication Sig Dispense Auth. Provider   ofloxacin (FLOXIN) 0.3 % OTIC solution Place 5 drops into the right ear 2 (two) times daily for 7 days. 3.5 mL Madelyn Brunner, DO      PDMP not reviewed this  encounter.   Madelyn Brunner, DO 12/27/21 1352

## 2021-12-27 NOTE — Discharge Instructions (Addendum)
You have an outer ear infection  -We will treat this with ofloxacin eardrops, apply 5 drops into the right ear let it sit for 5 minutes.  Do this twice a day for 7 days. -Cleaning out the ears or put anything into the ear -You may want to put a cotton ball in the ear to avoid water getting in while treated -You may take ibuprofen, Motrin or Tylenol for any pain control

## 2021-12-27 NOTE — ED Triage Notes (Signed)
Ear pain for 2 days, noticed drainage this morning, ear was bleeding. Right ear.  No known injury. Was standing and talking 2 days ago and felt a popping sensation.

## 2022-01-31 ENCOUNTER — Encounter (HOSPITAL_COMMUNITY): Payer: Self-pay | Admitting: *Deleted

## 2022-01-31 ENCOUNTER — Ambulatory Visit (HOSPITAL_COMMUNITY)
Admission: EM | Admit: 2022-01-31 | Discharge: 2022-01-31 | Disposition: A | Discharge status: Home / Self Care | Payer: Medicaid Other | Attending: Family Medicine | Admitting: Family Medicine

## 2022-01-31 ENCOUNTER — Other Ambulatory Visit: Payer: Self-pay

## 2022-01-31 DIAGNOSIS — R1111 Vomiting without nausea: Secondary | ICD-10-CM

## 2022-01-31 DIAGNOSIS — K219 Gastro-esophageal reflux disease without esophagitis: Secondary | ICD-10-CM | POA: Diagnosis not present

## 2022-01-31 MED ORDER — FAMOTIDINE 20 MG PO TABS: 20.0000 mg | ORAL_TABLET | Freq: Two times a day (BID) | ORAL | 0 refills | Status: DC

## 2022-01-31 MED ORDER — ONDANSETRON 4 MG PO TBDP: 4.0000 mg | ORAL_TABLET | Freq: Three times a day (TID) | ORAL | 0 refills | Status: DC | PRN

## 2022-01-31 NOTE — ED Provider Notes (Signed)
[facility]    CSN: [id number] Arrival date & time: 01/31/22  1700      History   Chief Complaint Chief Complaint  Patient presents with   Gastroesophageal Reflux   Constipation   Emesis    HPI Raven Medina is a 21 y.o. female.    Gastroesophageal Reflux  Constipation Associated symptoms: vomiting   Emesis  Here for sour burps and indigestion feeling.  She first noticed it last night.  She threw up about 2 times.  She states that this time she does not have any nausea.  She also feels that she has been constipated since yesterday.  Last bowel movement was yesterday.  Last menstrual cycle was July 5 and on time   She is concerned that the symptoms are consistent with reflux.  Past Medical History:  Diagnosis Date   ADHD (attention deficit hyperactivity disorder)    Allergy    seasonal   Asthma    intermittent   Iron deficiency anemia 04/17/2014   Obesity    Pregnancy induced hypertension    Supervision of normal first pregnancy, antepartum 02/19/2019    Nursing Staff Provider Office Location [facility] Dating  LMP Language  English Anatomy US  Normal - incomplete - FU > Normal  Flu Vaccine  Declined 06/27/2019 Genetic Screen  NIPS: LR girl   AFP:  TDaP vaccine   Declined 06/27/2019 Hgb A1C or  GTT Early 5.4 Third trimester nmL 75-117-91 Rhogam  NA   LAB RESULTS  Feeding Plan Pumping Breast/bottle Blood Type O/Positive/-- (08/05 1541)  Contracepti    Patient Active Problem List   Diagnosis Date Noted   Threatened preterm labor, third trimester 07/10/2020   Short interval between pregnancies complicating pregnancy, antepartum, unspecified trimester 02/27/2020   Alpha thalassemia silent carrier 02/05/2020   Dichorionic diamniotic twin gestation 01/23/2020   Supervision of high risk pregnancy, antepartum 12/24/2019   Acne vulgaris 01/11/2019   Adjustment disorder with anxiety 07/10/2015   ADHD (attention deficit hyperactivity disorder) 12/05/2012     Past Surgical History:  Procedure Laterality Date   CESAREAN SECTION MULTI-GESTATIONAL N/A 07/13/2020   Procedure: CESAREAN SECTION MULTI-GESTATIONAL;  Surgeon: Lazaro Arms, MD;  Location: [facility];  Service: Obstetrics;  Laterality: N/A;   HERNIA REPAIR     myringotomy with tubes Bilateral    at age 38   WISDOM TOOTH EXTRACTION      OB History     Gravida  2   Para  2   Term  1   Preterm  1   AB      Living  3      SAB      IAB      Ectopic      Multiple  1   Live Births  3            Home Medications    Prior to Admission medications   Medication Sig Start Date End Date Taking? Authorizing Provider  famotidine (PEPCID) 20 MG tablet Take 1 tablet (20 mg total) by mouth 2 (two) times daily. 01/31/22  Yes Zenia Resides, MD  ondansetron (ZOFRAN-ODT) 4 MG disintegrating tablet Take 1 tablet (4 mg total) by mouth every 8 (eight) hours as needed for nausea or vomiting. 01/31/22  Yes Zenia Resides, MD    Family History Family History  Problem Relation Age of Onset   Hypertension Mother    Hypertension Maternal Grandmother    Hypertension Maternal Grandfather  Social History Social History   Tobacco Use   Smoking status: Former    Types: Cigars   Smokeless tobacco: Never   Tobacco comments:    mother smokes inside home  Vaping Use   Vaping Use: Never used  Substance Use Topics   Alcohol use: Not Currently    Alcohol/week: 0.0 standard drinks of alcohol    Comment: Social when not pregnant   Drug use: Not Currently    Types: Marijuana    Comment: Last smoked 01/08/19     Allergies   Patient has no known allergies.   Review of Systems Review of Systems  Gastrointestinal:  Positive for constipation and vomiting.     Physical Exam Triage Vital Signs ED Triage Vitals  Enc Vitals Group     BP 01/31/22 1811 109/70     Pulse Rate 01/31/22 1811 68     Resp 01/31/22 1811 18     Temp 01/31/22 1811 98.8 F (37.1 C)      Temp src --      SpO2 01/31/22 1811 98 %     Weight --      Height --      Head Circumference --      Peak Flow --      Pain Score 01/31/22 1808 0     Pain Loc --      Pain Edu? --      Excl. in [place]? --    No data found.  Updated Vital Signs BP 109/70   Pulse 68   Temp 98.8 F (37.1 C)   Resp 18   LMP 01/26/2022   SpO2 98%   Visual Acuity Right Eye Distance:   Left Eye Distance:   Bilateral Distance:    Right Eye Near:   Left Eye Near:    Bilateral Near:     Physical Exam Vitals reviewed.  Constitutional:      General: She is not in acute distress.    Appearance: She is not ill-appearing, toxic-appearing or diaphoretic.  HENT:     Right Ear: Tympanic membrane normal.     Left Ear: Tympanic membrane normal.     Mouth/Throat:     Mouth: Mucous membranes are moist.     Pharynx: No oropharyngeal exudate or posterior oropharyngeal erythema.  Eyes:     Extraocular Movements: Extraocular movements intact.     Pupils: Pupils are equal, round, and reactive to light.  Cardiovascular:     Rate and Rhythm: Normal rate and regular rhythm.     Heart sounds: No murmur heard. Pulmonary:     Effort: Pulmonary effort is normal.     Breath sounds: Normal breath sounds.  Abdominal:     General: There is no distension.     Palpations: Abdomen is soft.     Tenderness: There is no abdominal tenderness.  Musculoskeletal:     Cervical back: Neck supple.  Lymphadenopathy:     Cervical: No cervical adenopathy.  Skin:    Capillary Refill: Capillary refill takes less than 2 seconds.     Coloration: Skin is not jaundiced or pale.  Neurological:     General: No focal deficit present.     Mental Status: She is alert and oriented to person, place, and time.  Psychiatric:        Behavior: Behavior normal.      UC Treatments / Results  Labs (all labs ordered are listed, but only abnormal results are displayed) Labs Reviewed - No  data to display  EKG   Radiology No results  found.  Procedures Procedures (including critical care time)  Medications Ordered in UC Medications - No data to display  Initial Impression / Assessment and Plan / UC Course  I have reviewed the triage vital signs and the nursing notes.  Pertinent labs & imaging results that were available during my care of the patient were reviewed by me and considered in my medical decision making (see chart for details).     I am sending in Zofran for nausea in case she needs it, and we will begin Pepcid for reflux.  Request made to help her find a PCP Final Clinical Impressions(s) / UC Diagnoses   Final diagnoses:  Vomiting without nausea, unspecified vomiting type  Gastroesophageal reflux disease without esophagitis     Discharge Instructions      Take famotidine 20 mg--1 tablet 2 times daily.  This is for stomach acid   Ondansetron dissolved in the mouth every 8 hours as needed for nausea or vomiting. Clear liquids and bland things to eat.       ED Prescriptions     Medication Sig Dispense Auth. Provider   ondansetron (ZOFRAN-ODT) 4 MG disintegrating tablet Take 1 tablet (4 mg total) by mouth every 8 (eight) hours as needed for nausea or vomiting. 10 tablet Zenia Resides, MD   famotidine (PEPCID) 20 MG tablet Take 1 tablet (20 mg total) by mouth 2 (two) times daily. 30 tablet Arlene Brickel, Janace Aris, MD      PDMP not reviewed this encounter.   Zenia Resides, MD 01/31/22 [phone]

## 2022-01-31 NOTE — Discharge Instructions (Addendum)
Take famotidine 20 mg--1 tablet 2 times daily.  This is for stomach acid   Ondansetron dissolved in the mouth every 8 hours as needed for nausea or vomiting. Clear liquids and bland things to eat.

## 2022-01-31 NOTE — ED Triage Notes (Signed)
Pt reports waking up at 0130 today with constipation and then vomited. Pt thinks she has GERD.

## 2022-02-03 ENCOUNTER — Encounter (HOSPITAL_COMMUNITY): Payer: Self-pay

## 2022-08-22 ENCOUNTER — Ambulatory Visit: Payer: Medicaid Other | Admitting: Nurse Practitioner

## 2023-03-09 ENCOUNTER — Ambulatory Visit (INDEPENDENT_AMBULATORY_CARE_PROVIDER_SITE_OTHER): Payer: Medicaid Other

## 2023-03-09 VITALS — BP 116/77 | HR 66 | Ht 65.0 in | Wt 190.9 lb

## 2023-03-09 DIAGNOSIS — Z3201 Encounter for pregnancy test, result positive: Secondary | ICD-10-CM | POA: Diagnosis not present

## 2023-03-09 LAB — POCT URINE PREGNANCY: Preg Test, Ur: POSITIVE — AB

## 2023-03-09 NOTE — Progress Notes (Signed)
[employer] here for a UPT. Pt had a positive upt at home. LMP is 01/14/2023.     UPT in office Positive.    Reviewed medications and informed to start a PNV.  Pt to follow up at [facility] for New OB intake on 03/28/2023.

## 2023-03-28 ENCOUNTER — Ambulatory Visit (INDEPENDENT_AMBULATORY_CARE_PROVIDER_SITE_OTHER): Payer: Medicaid Other | Admitting: *Deleted

## 2023-03-28 ENCOUNTER — Other Ambulatory Visit (INDEPENDENT_AMBULATORY_CARE_PROVIDER_SITE_OTHER): Payer: Medicaid Other

## 2023-03-28 VITALS — BP 126/76 | HR 78 | Wt 197.8 lb

## 2023-03-28 DIAGNOSIS — O0990 Supervision of high risk pregnancy, unspecified, unspecified trimester: Secondary | ICD-10-CM

## 2023-03-28 DIAGNOSIS — Z3A01 Less than 8 weeks gestation of pregnancy: Secondary | ICD-10-CM

## 2023-03-28 DIAGNOSIS — Z1339 Encounter for screening examination for other mental health and behavioral disorders: Secondary | ICD-10-CM | POA: Diagnosis not present

## 2023-03-28 DIAGNOSIS — O0991 Supervision of high risk pregnancy, unspecified, first trimester: Secondary | ICD-10-CM

## 2023-03-28 DIAGNOSIS — Z3A10 10 weeks gestation of pregnancy: Secondary | ICD-10-CM

## 2023-03-28 DIAGNOSIS — O3680X0 Pregnancy with inconclusive fetal viability, not applicable or unspecified: Secondary | ICD-10-CM

## 2023-03-28 MED ORDER — BLOOD PRESSURE KIT DEVI: 1.0000 | 0 refills | Status: AC

## 2023-03-28 NOTE — Progress Notes (Addendum)
New OB Intake  I connected with Raven Medina  on 03/28/23 at In Person Visit and verified that I am speaking with the correct person using two identifiers. Nurse is located at [facility] and pt is located at [place].  I discussed the limitations, risks, security and privacy concerns of performing an evaluation and management service by telephone and the availability of in person appointments. I also discussed with the patient that there may be a patient responsible charge related to this service. The patient expressed understanding and agreed to proceed.  I explained I am completing New OB Intake today. We discussed EDD of 10/21/2023, by Last Menstrual Period. Pt is [id number]. I reviewed her allergies, medications and Medical/Surgical/OB history.    Patient Active Problem List   Diagnosis Date Noted   Threatened preterm labor, third trimester 07/10/2020   Short interval between pregnancies complicating pregnancy, antepartum, unspecified trimester 02/27/2020   Alpha thalassemia silent carrier 02/05/2020   Dichorionic diamniotic twin gestation 01/23/2020   Acne vulgaris 01/11/2019   Adjustment disorder with anxiety 07/10/2015   ADHD (attention deficit hyperactivity disorder) 12/05/2012    Concerns addressed today  Delivery Plans Plans to deliver at [facility]. Discussed the nature of our practice with multiple providers including residents and students. Due to the size of the practice, the delivering provider may not be the same as those providing prenatal care.   Patient is not a candidate for water birth. Offered upcoming OB visit with CNM to discuss further.  MyChart/Babyscripts MyChart access verified. I explained pt will have some visits in office and some virtually. Babyscripts instructions given and order placed. Patient verifies receipt of registration text/e-mail. Account successfully created and app downloaded.  Blood Pressure Cuff/Weight Scale Blood pressure cuff ordered for  patient to pick-up from [employer]. Explained after first prenatal appt pt will check weekly and document in Babyscripts. Patient does not have weight scale; patient may purchase if they desire to track weight weekly in Babyscripts.  Anatomy US Explained first scheduled [place] will be around 19 weeks. Anatomy US scheduled for TBD at TBD.  Interested in [place]? If yes, send referral and doula dot phrase.   Is patient a candidate for Babyscripts Optimization? No - High risk  First visit review I reviewed new OB appt with patient. Explained pt will be seen by TBD at first visit. Discussed [name] genetic screening with patient. Requests Panorama and Horizon.. Routine prenatal labs  not collected, see additional notes.    Last Pap No results found for: "DIAGPAP"  Harrel Lemon, RN 03/28/2023  8:52 AM

## 2023-03-28 NOTE — Progress Notes (Addendum)
OB limited US shows intrauterine GS at approx [redacted] wks GA. YS and FP absent. Consulted with Dr. Berton Lan. Orders for repeat viability and dating scan in 2 weeks. Emotional support and thorough explanation provided to patient. Bleeding and abdominal pain precautions and MAU location reviewed. Pt verbalized understanding. [place] scheduled at check out.

## 2023-03-30 LAB — CULTURE, OB URINE

## 2023-03-30 LAB — URINE CULTURE, OB REFLEX

## 2023-04-10 ENCOUNTER — Encounter: Payer: Self-pay | Admitting: Obstetrics and Gynecology

## 2023-04-10 ENCOUNTER — Inpatient Hospital Stay (HOSPITAL_COMMUNITY)
Admission: AD | Admit: 2023-04-10 | Discharge: 2023-04-10 | Disposition: A | Discharge status: Home / Self Care | Payer: Medicaid Other | Attending: Obstetrics and Gynecology | Admitting: Obstetrics and Gynecology

## 2023-04-10 ENCOUNTER — Encounter (HOSPITAL_COMMUNITY): Payer: Self-pay | Admitting: Obstetrics and Gynecology

## 2023-04-10 ENCOUNTER — Inpatient Hospital Stay (HOSPITAL_COMMUNITY): Payer: Medicaid Other

## 2023-04-10 DIAGNOSIS — N96 Recurrent pregnancy loss: Secondary | ICD-10-CM

## 2023-04-10 DIAGNOSIS — R58 Hemorrhage, not elsewhere classified: Secondary | ICD-10-CM | POA: Diagnosis not present

## 2023-04-10 DIAGNOSIS — O034 Incomplete spontaneous abortion without complication: Secondary | ICD-10-CM | POA: Diagnosis present

## 2023-04-10 DIAGNOSIS — I959 Hypotension, unspecified: Secondary | ICD-10-CM | POA: Diagnosis not present

## 2023-04-10 DIAGNOSIS — O209 Hemorrhage in early pregnancy, unspecified: Secondary | ICD-10-CM | POA: Diagnosis not present

## 2023-04-10 DIAGNOSIS — R9389 Abnormal findings on diagnostic imaging of other specified body structures: Secondary | ICD-10-CM | POA: Diagnosis not present

## 2023-04-10 DIAGNOSIS — O039 Complete or unspecified spontaneous abortion without complication: Secondary | ICD-10-CM | POA: Diagnosis not present

## 2023-04-10 DIAGNOSIS — Z3A12 12 weeks gestation of pregnancy: Secondary | ICD-10-CM

## 2023-04-10 DIAGNOSIS — O200 Threatened abortion: Secondary | ICD-10-CM | POA: Diagnosis not present

## 2023-04-10 DIAGNOSIS — Z3A00 Weeks of gestation of pregnancy not specified: Secondary | ICD-10-CM | POA: Diagnosis not present

## 2023-04-10 DIAGNOSIS — N939 Abnormal uterine and vaginal bleeding, unspecified: Secondary | ICD-10-CM | POA: Diagnosis not present

## 2023-04-10 DIAGNOSIS — O029 Abnormal product of conception, unspecified: Secondary | ICD-10-CM | POA: Diagnosis not present

## 2023-04-10 LAB — ABO/RH: ABO/RH(D): O POS

## 2023-04-10 LAB — CBC
HCT: 26.2 % — ABNORMAL LOW (ref 36.0–46.0)
Hemoglobin: 8.3 g/dL — ABNORMAL LOW (ref 12.0–15.0)
MCH: 26.4 pg (ref 26.0–34.0)
MCHC: 31.7 g/dL (ref 30.0–36.0)
MCV: 83.4 fL (ref 80.0–100.0)
Platelets: 154 10*3/uL (ref 150–400)
RBC: 3.14 MIL/uL — ABNORMAL LOW (ref 3.87–5.11)
RDW: 16.6 % — ABNORMAL HIGH (ref 11.5–15.5)
WBC: 7.7 10*3/uL (ref 4.0–10.5)
nRBC: 0 % (ref 0.0–0.2)

## 2023-04-10 LAB — COMPREHENSIVE METABOLIC PANEL
ALT: 10 U/L (ref 0–44)
AST: 15 U/L (ref 15–41)
Albumin: 2.9 g/dL — ABNORMAL LOW (ref 3.5–5.0)
Alkaline Phosphatase: 44 U/L (ref 38–126)
Anion gap: 6 (ref 5–15)
BUN: 6 mg/dL (ref 6–20)
CO2: 23 mmol/L (ref 22–32)
Calcium: 8 mg/dL — ABNORMAL LOW (ref 8.9–10.3)
Chloride: 106 mmol/L (ref 98–111)
Creatinine, Ser: 0.78 mg/dL (ref 0.44–1.00)
GFR, Estimated: >60 mL/min (ref >60)
Glucose, Bld: 129 mg/dL — ABNORMAL HIGH (ref 70–99)
Potassium: 3.6 mmol/L (ref 3.5–5.1)
Sodium: 135 mmol/L (ref 135–145)
Total Bilirubin: 0.4 mg/dL (ref 0.3–1.2)
Total Protein: 5.6 g/dL — ABNORMAL LOW (ref 6.5–8.1)

## 2023-04-10 LAB — WET PREP, GENITAL
Clue Cells Wet Prep HPF POC: NONE SEEN
Sperm: NONE SEEN
Trich, Wet Prep: NONE SEEN
WBC, Wet Prep HPF POC: <10 (ref <10)
Yeast Wet Prep HPF POC: NONE SEEN

## 2023-04-10 LAB — HCG, QUANTITATIVE, PREGNANCY: hCG, Beta Chain, Quant, S: 716 m[IU]/mL — ABNORMAL HIGH (ref <5)

## 2023-04-10 LAB — HIV ANTIBODY (ROUTINE TESTING W REFLEX): HIV Screen 4th Generation wRfx: NONREACTIVE

## 2023-04-10 MED ORDER — LACTATED RINGERS IV BOLUS
1000.0000 mL | Freq: Once | INTRAVENOUS | Status: AC
  Administered 2023-04-10: 1000 mL via INTRAVENOUS

## 2023-04-10 MED ORDER — FENTANYL CITRATE (PF) 100 MCG/2ML IJ SOLN
50.0000 ug | INTRAMUSCULAR | Status: DC | PRN
  Administered 2023-04-10 (×2): 50 ug via INTRAVENOUS
  Filled 2023-04-10 (×2): qty 2

## 2023-04-10 MED ORDER — ACETAMINOPHEN-CAFFEINE 500-65 MG PO TABS
2.0000 | ORAL_TABLET | Freq: Once | ORAL | Status: AC
  Administered 2023-04-10: 2 via ORAL
  Filled 2023-04-10: qty 2

## 2023-04-10 MED ORDER — KETOROLAC TROMETHAMINE 30 MG/ML IJ SOLN
30.0000 mg | Freq: Once | INTRAMUSCULAR | Status: AC
  Administered 2023-04-10: 30 mg via INTRAVENOUS
  Filled 2023-04-10: qty 1

## 2023-04-10 MED ORDER — NAPROXEN 500 MG PO TABS: 500.0000 mg | ORAL_TABLET | Freq: Two times a day (BID) | ORAL | 0 refills | Status: AC | PRN

## 2023-04-10 MED ORDER — MISOPROSTOL 200 MCG PO TABS
800.0000 ug | ORAL_TABLET | Freq: Once | ORAL | Status: AC
  Administered 2023-04-10: 800 ug via ORAL
  Filled 2023-04-10: qty 4

## 2023-04-10 NOTE — MAU Note (Signed)
Vaginal bleeding post speculum exam is minimal and cramping has resolved. Up to bathroom for pericare. Tolerated without feeling light headed or dizzy. Pt consumed sandwich tray without nausea  or vomiting

## 2023-04-10 NOTE — MAU Note (Signed)
Pt reports uterine cramping has increased in intensity. Peri pad on x 30 min-lightly saturated with dark red bleeding

## 2023-04-10 NOTE — MAU Provider Note (Cosign Needed Addendum)
Chief Complaint: Abdominal Pain, Vaginal Bleeding, and Pelvic Pain  SUBJECTIVE HPI: Raven Medina is a 22 y.o. [id number] at [redacted]w[redacted]d by LMP who presents to maternity admissions reporting heavy vaginal bleeding since early today with associated nausea and cramping, after 2-3 days of light spotting with wiping. Known gestational sac seen at 6 weeks. Denies emesis, diarrhea, or constipation  She denies vaginal itching/burning, urinary symptoms, h/a, dizziness, n/v, or fever/chills.    HPI  Past Medical History:  Diagnosis Date   ADHD (attention deficit hyperactivity disorder)    Allergy    seasonal   Asthma    intermittent   Iron deficiency anemia 04/17/2014   Obesity    Pregnancy induced hypertension    Supervision of normal first pregnancy, antepartum 02/19/2019    Nursing Staff Provider Office Location [facility] Dating  LMP Language  English Anatomy US  Normal - incomplete - FU > Normal  Flu Vaccine  Declined 06/27/2019 Genetic Screen  NIPS: LR girl   AFP:  TDaP vaccine   Declined 06/27/2019 Hgb A1C or  GTT Early 5.4 Third trimester nmL 75-117-91 Rhogam  NA   LAB RESULTS  Feeding Plan Pumping Breast/bottle Blood Type O/Positive/-- (08/05 1541)  Contracepti   Past Surgical History:  Procedure Laterality Date   CESAREAN SECTION MULTI-GESTATIONAL N/A 07/13/2020   Procedure: CESAREAN SECTION MULTI-GESTATIONAL;  Surgeon: Lazaro Arms, MD;  Location: [facility] LD ORS;  Service: Obstetrics;  Laterality: N/A;   HERNIA REPAIR     myringotomy with tubes Bilateral    at age 71   WISDOM TOOTH EXTRACTION     Social History   Socioeconomic History   Marital status: Single    Spouse name: Not on file   Number of children: 1   Years of education: 12   Highest education level: High school graduate  Occupational History   Not on file  Tobacco Use   Smoking status: Former    Types: Cigars   Smokeless tobacco: Never   Tobacco comments:    mother smokes inside home  Vaping Use   Vaping status: Never  Used  Substance and Sexual Activity   Alcohol use: Not Currently    Alcohol/week: 0.0 standard drinks of alcohol    Comment: Social when not pregnant   Drug use: Not Currently    Types: Marijuana    Comment: Last smoked 01/08/19   Sexual activity: Not Currently    Birth control/protection: None  Other Topics Concern   Not on file  Social History Narrative   Lives with Celine Ahr and Kateri Mc, who are legal guardians.  Aunt smokes inside the home and in the car.   Tobacco?  no   Drugs/ETOH?  no   Partner preference?  female Sexually Active?  In past - not currently   Pregnancy Prevention:  condoms and implant, reviewed condoms & plan B   Safe at home, in school & in relationships?  Yes   Safe to self?  Yes       Social Determinants of Health   Financial Resource Strain: Low Risk  (02/19/2019)   Overall Financial Resource Strain (CARDIA)    Difficulty of Paying Living Expenses: Not very hard  Food Insecurity: No Food Insecurity (07/23/2020)   Hunger Vital Sign    Worried About Running Out of Food in the Last Year: Never true    Ran Out of Food in the Last Year: Never true  Transportation Needs: No Transportation Needs (07/23/2020)   PRAPARE - Transportation    Lack  of Transportation (Medical): No    Lack of Transportation (Non-Medical): No  Physical Activity: Inactive (02/19/2019)   Exercise Vital Sign    Days of Exercise per Week: 0 days    Minutes of Exercise per Session: 0 min  Stress: No Stress Concern Present (02/19/2019)   [employer] of Occupational Health - Occupational Stress Questionnaire    Feeling of Stress : Not at all  Social Connections: Unknown (12/07/2021)   Received from [facility]   Social Network    Social Network: Not on file  Intimate Partner Violence: Unknown (10/29/2021)   Received from [facility], [facility]   HITS    Physically Hurt: Not on file    Insult or Talk Down To: Not on file    Threaten Physical Harm: Not on file     Scream or Curse: Not on file   No current facility-administered medications on file prior to encounter.   Current Outpatient Medications on File Prior to Encounter  Medication Sig Dispense Refill   Blood Pressure Monitoring (BLOOD PRESSURE KIT) DEVI 1 Device by Does not apply route once a week. 1 each 0   famotidine (PEPCID) 20 MG tablet Take 1 tablet (20 mg total) by mouth 2 (two) times daily. 30 tablet 0   ondansetron (ZOFRAN-ODT) 4 MG disintegrating tablet Take 1 tablet (4 mg total) by mouth every 8 (eight) hours as needed for nausea or vomiting. 10 tablet 0   Prenatal Vit-Fe Fumarate-FA (PRENATAL MULTIVITAMIN) TABS tablet Take 1 tablet by mouth daily at 12 noon.     No Known Allergies  I have reviewed patient's Past Medical Hx, Surgical Hx, Family Hx, Social Hx, medications and allergies.   ROS:  Review of Systems Review of Systems  Other systems negative   Physical Exam  Physical Exam Patient Vitals for the past 24 hrs:  BP Temp Temp src Pulse Resp SpO2 Height Weight  04/10/23 1847 (!) 97/52 -- -- 84 -- -- -- --  04/10/23 1833 106/68 -- -- 83 -- -- -- --  04/10/23 1757 (!) 101/52 -- -- 85 -- -- -- --  04/10/23 1752 (!) 85/72 -- -- 83 -- -- -- --  04/10/23 1750 102/65 -- -- 81 -- 100 % -- --  04/10/23 1740 100/64 -- -- 82 -- 99 % -- --  04/10/23 1735 (!) 86/70 -- -- 78 -- -- -- --  04/10/23 1730 106/84 -- -- 60 -- -- -- --  04/10/23 1727 (!) 91/58 -- -- 76 -- -- -- --  04/10/23 1723 (!) 83/57 -- -- 79 -- -- -- --  04/10/23 1714 (!) 81/52 -- -- 74 18 100 % -- --  04/10/23 1707 -- 99.2 F (37.3 C) Oral -- -- -- 5\' 5"  (1.651 m) 86.2 kg   Constitutional: Well-developed, well-nourished female in no acute distress.  Cardiovascular: normal rate Respiratory: normal effort GI: Abd soft, non-tender. Pos BS x 4 MS: Extremities nontender, no edema, normal ROM Neurologic: Alert and oriented x 4.  GU: pooling bleeding from external os and found to have large collection of tissue at  the cervical os making it appear to be about 3cm dilated.    LAB RESULTS Results for orders placed or performed during the hospital encounter of 04/10/23 (from the past 24 hour(s))  HIV Antibody (routine testing w rflx)     Status: None   Collection Time: 04/10/23  6:09 PM  Result Value Ref Range   HIV Screen 4th Generation wRfx Non  Reactive Non Reactive  CBC     Status: Abnormal   Collection Time: 04/10/23  6:09 PM  Result Value Ref Range   WBC 7.7 4.0 - 10.5 K/uL   RBC 3.14 (L) 3.87 - 5.11 MIL/uL   Hemoglobin 8.3 (L) 12.0 - 15.0 g/dL   HCT 13.2 (L) 44.0 - 10.2 %   MCV 83.4 80.0 - 100.0 fL   MCH 26.4 26.0 - 34.0 pg   MCHC 31.7 30.0 - 36.0 g/dL   RDW 72.5 (H) 36.6 - 44.0 %   Platelets 154 150 - 400 K/uL   nRBC 0.0 0.0 - 0.2 %  ABO/Rh     Status: None   Collection Time: 04/10/23  6:09 PM  Result Value Ref Range   ABO/RH(D) O POS    No rh immune globuloin      NOT A RH IMMUNE GLOBULIN CANDIDATE, PT RH POSITIVE Performed at [facility], [address] [place], [place] [address]   hCG, quantitative, pregnancy     Status: Abnormal   Collection Time: 04/10/23  6:09 PM  Result Value Ref Range   hCG, Beta Chain, Quant, S 716 (H) <5 mIU/mL  Comprehensive metabolic panel     Status: Abnormal   Collection Time: 04/10/23  6:09 PM  Result Value Ref Range   Sodium 135 135 - 145 mmol/L   Potassium 3.6 3.5 - 5.1 mmol/L   Chloride 106 98 - 111 mmol/L   CO2 23 22 - 32 mmol/L   Glucose, Bld 129 (H) 70 - 99 mg/dL   BUN 6 6 - 20 mg/dL   Creatinine, Ser 5.95 0.44 - 1.00 mg/dL   Calcium 8.0 (L) 8.9 - 10.3 mg/dL   Total Protein 5.6 (L) 6.5 - 8.1 g/dL   Albumin 2.9 (L) 3.5 - 5.0 g/dL   AST 15 15 - 41 U/L   ALT 10 0 - 44 U/L   Alkaline Phosphatase 44 38 - 126 U/L   Total Bilirubin 0.4 0.3 - 1.2 mg/dL   GFR, Estimated >63 >87 mL/min   Anion gap 6 5 - 15  Wet prep, genital     Status: None   Collection Time: 04/10/23  6:12 PM  Result Value Ref Range   Yeast Wet Prep HPF POC NONE  SEEN NONE SEEN   Trich, Wet Prep NONE SEEN NONE SEEN   Clue Cells Wet Prep HPF POC NONE SEEN NONE SEEN   WBC, Wet Prep HPF POC <10 <10   Sperm NONE SEEN     --/--/O POS (09/16 1809)  IMAGING US OB LESS THAN 14 WEEKS WITH OB TRANSVAGINAL  Result Date: 04/10/2023 CLINICAL DATA:  Vaginal bleeding.  Beta HCG pending.  LMP 01/14/2023 EXAM: OBSTETRIC <14 WK [place] AND TRANSVAGINAL OB US TECHNIQUE: Both transabdominal and transvaginal ultrasound examinations were performed for complete evaluation of the gestation as well as the maternal uterus, adnexal regions, and pelvic cul-de-sac. Transvaginal technique was performed to assess early pregnancy. COMPARISON:  Ultrasound 03/28/2023 FINDINGS: Intrauterine gestational sac: Single Yolk sac:  Not Visualized. Embryo:  Not Visualized. Cardiac Activity: Not Visualized. MSD: 14.1 mm 6 w 2 d. This is not substantially changed in size from 03/28/2023 Subchorionic hemorrhage:  None visualized. Maternal uterus/adnexae: Mildly thickened endometrium measuring 23 mm. The right ovary is normal. The left ovary was not visualized. No free fluid. Other: Transvaginal portion of the exam was ended early due to patient discomfort. IMPRESSION: Findings meet definitive criteria for nonviable pregnancy. This follows SRU consensus  guidelines: Diagnostic Criteria for Nonviable Pregnancy Early in the First Trimester. [facility] Med [phone]. Electronically Signed   By: Minerva Fester M.D.   On: 04/10/2023 20:15    MAU Management/MDM: I have reviewed the triage vital signs and the nursing notes.   Pertinent labs & imaging results that were available during my care of the patient were reviewed by me and considered in my medical decision making (see chart for details).      I have reviewed her medical records including past results, notes and treatments. Medical, Surgical, and family history were reviewed.  Medications and recent lab tests were reviewed  Ordered CMP, CBC, type and  screen Pelvic exam and cultures done Will check baseline Ultrasound to rule out SAB.  Treatments in MAU included 1L LR bolus, CMP, CBC, TVUS.   This bleeding/pain can represent a normal pregnancy with bleeding, spontaneous abortion or even an ectopic which can be life-threatening.  The process as listed above helps to determine which of these is present.   ASSESSMENT 1. Vaginal bleeding   2. [redacted] weeks gestation of pregnancy   3. Incomplete miscarriage   4. Recurrent pregnancy loss   Incomplete miscarriage suspected  [place] read pending Pelvic exam pending adequate pain control  Signed out patient to Dr. Joycelyn Das, MD FMOB Fellow, Faculty practice [facility]  04/10/2023  8:57 PM  Update: Pelvic exam under IV Pain medication revealed large collection of tissue at the external os, upon review of the [place] it appears that this is likely the collection of tissue previously seen making its way to and through internal os of cervix.  External os appeared to be about 3cm dilated 2/2 tissue collection with pooling of blood in the vaginal vault.  Patient tolerated collection of this tissue using ring forceps and reports cessation of cramping as well as external os returning to FT to 1cm dilation after extraction. Tissue sent to pathology. Pt offered genetics and would like to peruse those as well. Rh positive so no need for Rhogam at this time. Will give Misoprostol 800mg  prior to her leaving today. Will send send with a three day supply of Naproxen 500mg  BID, normal Cr.  Discharge in stable condition after completion of 2nd LR bolus and misoprostol delivery.  Was able to tolerate eating as well prior to d/c.  Given strict / usual return precautions.    Mittie Bodo, MD Family Medicine - Obstetrics Fellow

## 2023-04-10 NOTE — MAU Note (Signed)
.  Raven Medina is a 22 y.o. at [redacted]w[redacted]d here in MAU reporting: she started having light pink spotting on Friday. Woke up today with lower abdominal + vaginal cramping along with heavy vaginal bleeding. She has changed adult briefs 4-5x today and reports passing 7-8 "golf ball sized" clots. She has felt lightheaded.   LMP: 01/14/23 Onset of complaint: Friday Pain score: 7/10 FHT: attempted, RN unable to doppler Vitals:   04/10/23 1707 04/10/23 1714  BP:  (!) 81/52  Pulse:  74  Temp: 99.2 F (37.3 C)   SpO2:  100%     Lab orders placed from triage:  N/A

## 2023-04-10 NOTE — MAU Note (Signed)
Pelvic exam completed-product removed from cervix per Dr.Chubb-pericare completed-pt tolerated well.

## 2023-04-11 LAB — GC/CHLAMYDIA PROBE AMP (~~LOC~~) NOT AT ARMC
Chlamydia: NEGATIVE
Comment: NEGATIVE
Comment: NORMAL
Neisseria Gonorrhea: NEGATIVE

## 2023-04-12 ENCOUNTER — Other Ambulatory Visit: Payer: Medicaid Other

## 2023-04-13 ENCOUNTER — Encounter: Payer: Medicaid Other | Admitting: Obstetrics and Gynecology

## 2023-04-13 ENCOUNTER — Telehealth: Payer: Medicaid Other | Admitting: Obstetrics and Gynecology

## 2023-04-13 ENCOUNTER — Encounter: Payer: Self-pay | Admitting: Obstetrics and Gynecology

## 2023-04-13 DIAGNOSIS — Z712 Person consulting for explanation of examination or test findings: Secondary | ICD-10-CM

## 2023-04-13 NOTE — Progress Notes (Signed)
GYNECOLOGY VIRTUAL VISIT ENCOUNTER NOTE  [facility]: [facility]   Patient location: Home  I connected with Raven Medina on 04/13/23 at 10:15 AM EDT by MyChart Video Encounter and verified that I am speaking with the correct person using two identifiers.   I discussed the limitations, risks, security and privacy concerns of performing an evaluation and management service virtually and the availability of in person appointments. I also discussed with the patient that there may be a patient responsible charge related to this service. The patient expressed understanding and agreed to proceed.   History:  Raven Medina is a 22 y.o. [phone] female being evaluated today to discuss recent test results. Patient was seen in MAU on 04/10/23 and diagnosed with a miscarriage. She reports persistent vaginal bleeding since her MAU visit but not as heavy. She denies any abnormal vaginal discharge, pelvic pain or other concerns.  Patient is not interested in conceiving in the near future     Past Medical History:  Diagnosis Date   ADHD (attention deficit hyperactivity disorder)    Allergy    seasonal   Asthma    intermittent   Iron deficiency anemia 04/17/2014   Obesity    Pregnancy induced hypertension    Supervision of normal first pregnancy, antepartum 02/19/2019    Nursing Staff Provider Office Location [facility] Dating  LMP Language  English Anatomy US  Normal - incomplete - FU > Normal  Flu Vaccine  Declined 06/27/2019 Genetic Screen  NIPS: LR girl   AFP:  TDaP vaccine   Declined 06/27/2019 Hgb A1C or  GTT Early 5.4 Third trimester nmL 75-117-91 Rhogam  NA   LAB RESULTS  Feeding Plan Pumping Breast/bottle Blood Type O/Positive/-- (08/05 1541)  Contracepti   Past Surgical History:  Procedure Laterality Date   CESAREAN SECTION MULTI-GESTATIONAL N/A 07/13/2020   Procedure: CESAREAN SECTION MULTI-GESTATIONAL;  Surgeon: Lazaro Arms, MD;  [facility]: [facility]  [facility];  Service: Obstetrics;  Laterality: N/A;   HERNIA REPAIR     myringotomy with tubes Bilateral    at age 31   WISDOM TOOTH EXTRACTION     The following portions of the patient's history were reviewed and updated as appropriate: allergies, current medications, past family history, past medical history, past social history, past surgical history and problem list.   Health Maintenance:  Pap smear overdue  Review of Systems:  Pertinent items noted in HPI and remainder of comprehensive ROS otherwise negative.  Physical Exam:   General:  Alert, oriented and cooperative. Patient appears to be in no acute distress.  Mental Status: Normal mood and affect. Normal behavior. Normal judgment and thought content.   Respiratory: Normal respiratory effort, no problems with respiration noted  Rest of physical exam deferred due to type of encounter  Labs and Imaging Results for orders placed or performed during the hospital encounter of 04/10/23 (from the past 336 hour(s))  GC/Chlamydia probe amp ([place])not at [facility]   Collection Time: 04/10/23  5:09 PM  Result Value Ref Range   Neisseria Gonorrhea Negative    Chlamydia Negative    Comment Normal Reference Ranger Chlamydia - Negative    Comment      Normal Reference Range Neisseria Gonorrhea - Negative  HIV Antibody (routine testing w rflx)   Collection Time: 04/10/23  6:09 PM  Result Value Ref Range   HIV Screen 4th Generation wRfx Non Reactive Non Reactive  CBC   Collection Time: 04/10/23  6:09 PM  Result Value Ref Range   WBC 7.7 4.0 - 10.5 K/uL   RBC 3.14 (L) 3.87 - 5.11 MIL/uL   Hemoglobin 8.3 (L) 12.0 - 15.0 g/dL   HCT 16.1 (L) 09.6 - 04.5 %   MCV 83.4 80.0 - 100.0 fL   MCH 26.4 26.0 - 34.0 pg   MCHC 31.7 30.0 - 36.0 g/dL   RDW 40.9 (H) 81.1 - 91.4 %   Platelets 154 150 - 400 K/uL   nRBC 0.0 0.0 - 0.2 %  hCG, quantitative, pregnancy   Collection Time: 04/10/23  6:09 PM  Result Value Ref Range   hCG, Beta Chain, Quant, S  716 (H) <5 mIU/mL  Comprehensive metabolic panel   Collection Time: 04/10/23  6:09 PM  Result Value Ref Range   Sodium 135 135 - 145 mmol/L   Potassium 3.6 3.5 - 5.1 mmol/L   Chloride 106 98 - 111 mmol/L   CO2 23 22 - 32 mmol/L   Glucose, Bld 129 (H) 70 - 99 mg/dL   BUN 6 6 - 20 mg/dL   Creatinine, Ser 7.82 0.44 - 1.00 mg/dL   Calcium 8.0 (L) 8.9 - 10.3 mg/dL   Total Protein 5.6 (L) 6.5 - 8.1 g/dL   Albumin 2.9 (L) 3.5 - 5.0 g/dL   AST 15 15 - 41 U/L   ALT 10 0 - 44 U/L   Alkaline Phosphatase 44 38 - 126 U/L   Total Bilirubin 0.4 0.3 - 1.2 mg/dL   GFR, Estimated >95 >62 mL/min   Anion gap 6 5 - 15  ABO/Rh   Collection Time: 04/10/23  6:09 PM  Result Value Ref Range   ABO/RH(D) O POS    No rh immune globuloin      NOT A RH IMMUNE GLOBULIN CANDIDATE, PT RH POSITIVE Performed at [facility], [address], [place], [place] [address]   Wet prep, genital   Collection Time: 04/10/23  6:12 PM  Result Value Ref Range   Yeast Wet Prep HPF POC NONE SEEN NONE SEEN   Trich, Wet Prep NONE SEEN NONE SEEN   Clue Cells Wet Prep HPF POC NONE SEEN NONE SEEN   WBC, Wet Prep HPF POC <10 <10   Sperm NONE SEEN   Surgical pathology   Collection Time: 04/10/23  8:56 PM  Result Value Ref Range   SURGICAL PATHOLOGY      SURGICAL PATHOLOGY CASE: [id number] PATIENT: Raven Medina Surgical Pathology Report     Clinical History: None provided     FINAL MICROSCOPIC DIAGNOSIS:  A. PRODUCTS OF CONCEPTION: -  Immature/hydropic chorionic villi consistent with products of conception  Note: Correlation with beta-hCG recommended.  GROSS DESCRIPTION:  The specimen is received fresh and consists of a 7.3 x 4.1 x 2.2 cm aggregate of red-brown soft tissue and clotted blood.  Sectioning reveals a possible gestational sac, and no fetal tissue is grossly identified.  Representative sections are submitted in 3 cassettes.  Lovey Newcomer 04/11/2023)    Final Diagnosis performed by Orene Desanctis DO.   Electronically signed 04/12/2023 Technical and / or Professional components performed at [facility], [address], [place], [place] [address].  Immunohistochemistry Technical component (if applicable) was performed at [facility]. [address], [address], [place], [place] [address].   IMMUNOHISTOCHEMISTRY DISCLAIMER (if applicable): Some of these immunohistochemical stains may have been developed and the performance characteristics determine by [facility]. Some may not have been cleared or  approved by the U.S. Food and Drug Administration. The FDA has determined that such clearance or approval is not necessary. This test is used for clinical purposes. It should not be regarded as investigational or for research. This laboratory is certified under the Clinical Laboratory Improvement Amendments of 1988 (CLIA-88) as qualified to perform high complexity clinical laboratory testing.  The controls stained appropriately.   IHC stains are performed on formalin fixed, paraffin embedded tissue using a 3,3"diaminobenzidine (DAB) chromogen and Leica Bond Autostainer System. The staining intensity of the nucleus is score manually and is reported as the percentage of tumor cell nuclei demonstrating specific nuclear stainin g. The specimens are fixed in 10% Neutral Formalin for at least 6 hours and up to 72hrs. These tests are validated on decalcified tissue. Results should be interpreted with caution given the possibility of false negative results on decalcified specimens. Antibody Clones are as follows ER-clone 50F, PR-clone 16, Ki67- clone MM1. Some of these immunohistochemical stains may have been developed and the performance characteristics determined by [facility].    US OB LESS THAN 14 WEEKS WITH OB TRANSVAGINAL  Result Date: 04/10/2023 CLINICAL DATA:  Vaginal bleeding.  Beta HCG pending.  LMP 01/14/2023 EXAM:  OBSTETRIC <14 WK [place] AND TRANSVAGINAL OB US TECHNIQUE: Both transabdominal and transvaginal ultrasound examinations were performed for complete evaluation of the gestation as well as the maternal uterus, adnexal regions, and pelvic cul-de-sac. Transvaginal technique was performed to assess early pregnancy. COMPARISON:  Ultrasound 03/28/2023 FINDINGS: Intrauterine gestational sac: Single Yolk sac:  Not Visualized. Embryo:  Not Visualized. Cardiac Activity: Not Visualized. MSD: 14.1 mm 6 w 2 d. This is not substantially changed in size from 03/28/2023 Subchorionic hemorrhage:  None visualized. Maternal uterus/adnexae: Mildly thickened endometrium measuring 23 mm. The right ovary is normal. The left ovary was not visualized. No free fluid. Other: Transvaginal portion of the exam was ended early due to patient discomfort. IMPRESSION: Findings meet definitive criteria for nonviable pregnancy. This follows SRU consensus guidelines: Diagnostic Criteria for Nonviable Pregnancy Early in the First Trimester. Macy Mis J Med [phone]. Electronically Signed   By: Minerva Fester M.D.   On: 04/10/2023 20:15       Assessment and Plan:     1. Encounter to discuss test results 22 yo diagnosed with a miscarriage on 04/10/23 - reviewed ultrasound results - reviewed pathology results consistent with products of conception - Anora results not yet available - Patient desires contraception but is exploring her options - RTC within 3 months for annual exam with pap smear and contraception if needed       I discussed the assessment and treatment plan with the patient. The patient was provided an opportunity to ask questions and all were answered. The patient agreed with the plan and demonstrated an understanding of the instructions.   The patient was advised to call back or seek an in-person evaluation/go to the ED if the symptoms worsen or if the condition fails to improve as anticipated.  I provided 15 minutes of  face-to-face time during this encounter.   Catalina Antigua, MD [facility] [employer], [facility]

## 2023-04-18 LAB — ANORA MISCARRIAGE TEST - FRESH

## 2024-04-11 ENCOUNTER — Ambulatory Visit (HOSPITAL_COMMUNITY)
Admission: EM | Admit: 2024-04-11 | Discharge: 2024-04-11 | Disposition: A | Discharge status: Home / Self Care | Attending: Emergency Medicine | Admitting: Emergency Medicine

## 2024-04-11 ENCOUNTER — Encounter (HOSPITAL_COMMUNITY): Payer: Self-pay | Admitting: Emergency Medicine

## 2024-04-11 DIAGNOSIS — K0889 Other specified disorders of teeth and supporting structures: Secondary | ICD-10-CM | POA: Diagnosis not present

## 2024-04-11 DIAGNOSIS — N898 Other specified noninflammatory disorders of vagina: Secondary | ICD-10-CM | POA: Diagnosis not present

## 2024-04-11 LAB — HIV ANTIBODY (ROUTINE TESTING W REFLEX): HIV Screen 4th Generation wRfx: NONREACTIVE

## 2024-04-11 MED ORDER — KETOROLAC TROMETHAMINE 30 MG/ML IJ SOLN
30.0000 mg | Freq: Once | INTRAMUSCULAR | Status: AC
  Administered 2024-04-11: 30 mg via INTRAMUSCULAR

## 2024-04-11 MED ORDER — AMOXICILLIN-POT CLAVULANATE 875-125 MG PO TABS: 1.0000 | ORAL_TABLET | Freq: Two times a day (BID) | ORAL | 0 refills | Status: DC

## 2024-04-11 MED ORDER — KETOROLAC TROMETHAMINE 30 MG/ML IJ SOLN
INTRAMUSCULAR | Status: AC
  Filled 2024-04-11: qty 1

## 2024-04-11 MED ORDER — KETOROLAC TROMETHAMINE 10 MG PO TABS: 10.0000 mg | ORAL_TABLET | Freq: Four times a day (QID) | ORAL | 0 refills | Status: DC | PRN

## 2024-04-11 NOTE — Discharge Instructions (Signed)
 Starting Augmentin  twice daily for 7 days for dental infection coverage. You were given an injection of Toradol  in clinic today to help with your pain.  We at least 8 hours after receiving this injection to take any additional Toradol  as needed for pain. Do not take ibuprofen  while taking this medication. You can take 500 to 1000 mg of Tylenol  as needed for breakthrough pain every 6-8 hours.  Do not exceed 4000 mg in 1 day. Keep your appointment with your dentist on Monday to follow-up regarding dental pain.  Your swab and blood work results will come back over the next few days and someone will call if results are positive and require treatment.  Return here as needed.

## 2024-04-11 NOTE — ED Triage Notes (Signed)
 Pt c/o left dental pain x 2 days. Taking ibuprofen  and tylenol  that aren't helping. Unable to get into dentist until Monday.

## 2024-04-11 NOTE — ED Provider Notes (Signed)
 [facility]    CSN: [id number] Arrival date & time: 04/11/24  1304      History   Chief Complaint Chief Complaint  Patient presents with   Dental Pain    HPI Raven Medina is a 23 y.o. female.   Patient presents with left lower dental pain x 2 days.  Patient states that she has also noticed some swelling to the surrounding gums and feels like her face is swollen as well.  Patient states that she did make an appoint with a dentist but cannot see them until Monday.  Patient states that she has been taking Tylenol  and ibuprofen  without relief.  Denies fever, body aches, and chills.  Patient reports that she does have a broken tooth to the left lower side of her mouth, but is unsure when this tooth broke.  Patient also reports abnormal vaginal discharge x 2 days.  Patient states that she did start her menstrual cycle on 9/15, but states that she has some abnormal discharge with this.  Patient denies vaginal itching, vaginal pain, vaginal lesions, dysuria, hematuria, and urinary frequency.  Patient would like STD testing to be sure.   The history is provided by the patient and medical records.  Dental Pain   Past Medical History:  Diagnosis Date   ADHD (attention deficit hyperactivity disorder)    Allergy    seasonal   Asthma    intermittent   Iron  deficiency anemia 04/17/2014   Obesity    Pregnancy induced hypertension    Supervision of normal first pregnancy, antepartum 02/19/2019    Nursing Staff Provider Office Location [facility] Dating  LMP Language  English Anatomy US   Normal - incomplete - FU > Normal  Flu Vaccine  Declined 06/27/2019 Genetic Screen  NIPS: LR girl   AFP:  TDaP vaccine   Declined 06/27/2019 Hgb A1C or  GTT Early 5.4 Third trimester nmL 75-117-91 Rhogam  NA   LAB RESULTS  Feeding Plan Pumping Breast/bottle Blood Type O/Positive/-- (08/05 1541)  Contracepti    Patient Active Problem List   Diagnosis Date Noted   Supervision of high risk  pregnancy, antepartum 03/28/2023   Threatened preterm labor, third trimester 07/10/2020   Short interval between pregnancies complicating pregnancy, antepartum, unspecified trimester 02/27/2020   Alpha thalassemia silent carrier 02/05/2020   Dichorionic diamniotic twin gestation 01/23/2020   Acne vulgaris 01/11/2019   Adjustment disorder with anxiety 07/10/2015   ADHD (attention deficit hyperactivity disorder) 12/05/2012    Past Surgical History:  Procedure Laterality Date   CESAREAN SECTION MULTI-GESTATIONAL N/A 07/13/2020   Procedure: CESAREAN SECTION MULTI-GESTATIONAL;  Surgeon: Jayne Vonn DEL, MD;  Location: [facility] LD [facility];  Service: Obstetrics;  Laterality: N/A;   HERNIA REPAIR     myringotomy with tubes Bilateral    at age 89   WISDOM TOOTH EXTRACTION      OB History     Gravida  3   Para  2   Term  1   Preterm  1   AB  0   Living  3      SAB  0   IAB  0   Ectopic  0   Multiple  1   Live Births  3            Home Medications    Prior to Admission medications   Medication Sig Start Date End Date Taking? Authorizing Provider  amoxicillin -clavulanate (AUGMENTIN ) 875-125 MG tablet Take 1 tablet by mouth every 12 (twelve) hours. 04/11/24  Yes Johnie Flaming A, NP  ketorolac  (TORADOL ) 10 MG tablet Take 1 tablet (10 mg total) by mouth every 6 (six) hours as needed for moderate pain (pain score 4-6) or severe pain (pain score 7-10). 04/11/24  Yes Johnie Flaming A, NP  Blood Pressure Monitoring (BLOOD PRESSURE KIT) DEVI 1 Device by Does not apply route once a week. 03/28/23   Erik Kieth BROCKS, MD  Prenatal Vit-Fe Fumarate-FA (PRENATAL MULTIVITAMIN) TABS tablet Take 1 tablet by mouth daily at 12 noon.    [provider]    Family History Family History  Problem Relation Age of Onset   Hypertension Mother    Stroke Maternal Grandmother    Hypertension Maternal Grandmother    Hypertension Maternal Grandfather     Social History Social History    Tobacco Use   Smoking status: Former    Types: Cigars   Smokeless tobacco: Never   Tobacco comments:    mother smokes inside home  Vaping Use   Vaping status: Never Used  Substance Use Topics   Alcohol use: Not Currently    Alcohol/week: 0.0 standard drinks of alcohol    Comment: Social when not pregnant   Drug use: Not Currently    Types: Marijuana    Comment: Last smoked 01/08/19     Allergies   Patient has no known allergies.   Review of Systems Review of Systems  Per HPI  Physical Exam Triage Vital Signs ED Triage Vitals  Encounter Vitals Group     BP 04/11/24 1341 122/73     Girls Systolic BP Percentile --      Girls Diastolic BP Percentile --      Boys Systolic BP Percentile --      Boys Diastolic BP Percentile --      Pulse Rate 04/11/24 1341 77     Resp 04/11/24 1341 16     Temp 04/11/24 1341 98.6 F (37 C)     Temp Source 04/11/24 1341 Oral     SpO2 04/11/24 1341 97 %     Weight --      Height --      Head Circumference --      Peak Flow --      Pain Score 04/11/24 1340 7     Pain Loc --      Pain Education --      Exclude from Growth Chart --    No data found.  Updated Vital Signs BP 122/73 (BP Location: Left Arm)   Pulse 77   Temp 98.6 F (37 C) (Oral)   Resp 16   LMP 04/08/2024 (Exact Date)   SpO2 97%   Visual Acuity Right Eye Distance:   Left Eye Distance:   Bilateral Distance:    Right Eye Near:   Left Eye Near:    Bilateral Near:     Physical Exam Vitals and nursing note reviewed.  Constitutional:      General: She is awake. She is not in acute distress.    Appearance: Normal appearance. She is well-developed and well-groomed. She is not ill-appearing.  HENT:     Mouth/Throat:     Dentition: Abnormal dentition. Dental tenderness and gingival swelling present.   Genitourinary:    Comments: Exam deferred Skin:    General: Skin is warm and dry.  Neurological:     Mental Status: She is alert.  Psychiatric:         Behavior: Behavior is cooperative.  UC Treatments / Results  Labs (all labs ordered are listed, but only abnormal results are displayed) Labs Reviewed  HIV ANTIBODY (ROUTINE TESTING W REFLEX)  RPR  CERVICOVAGINAL ANCILLARY ONLY    EKG   Radiology No results found.  Procedures Procedures (including critical care time)  Medications Ordered in UC Medications  ketorolac  (TORADOL ) 30 MG/ML injection 30 mg (30 mg Intramuscular Given 04/11/24 1418)    Initial Impression / Assessment and Plan / UC Course  I have reviewed the triage vital signs and the nursing notes.  Pertinent labs & imaging results that were available during my care of the patient were reviewed by me and considered in my medical decision making (see chart for details).     Patient is overall well-appearing.  Vitals are stable.  Exam findings consistent with start of possible dental infection.  Prescribed Augmentin  for dental infection coverage.  Given IM Toradol  in clinic for acute pain.  Prescribed additional Toradol  as needed for pain.  GU exam deferred.  Patient perform self swab for STD/STI.  HIV and RPR ordered.  Discussed follow-up and return precautions. Final Clinical Impressions(s) / UC Diagnoses   Final diagnoses:  Pain, dental  Vaginal discharge     Discharge Instructions      Starting Augmentin  twice daily for 7 days for dental infection coverage. You were given an injection of Toradol  in clinic today to help with your pain.  We at least 8 hours after receiving this injection to take any additional Toradol  as needed for pain. Do not take ibuprofen  while taking this medication. You can take 500 to 1000 mg of Tylenol  as needed for breakthrough pain every 6-8 hours.  Do not exceed 4000 mg in 1 day. Keep your appointment with your dentist on Monday to follow-up regarding dental pain.  Your swab and blood work results will come back over the next few days and someone will call if results are  positive and require treatment.  Return here as needed.   ED Prescriptions     Medication Sig Dispense Auth. Provider   amoxicillin -clavulanate (AUGMENTIN ) 875-125 MG tablet Take 1 tablet by mouth every 12 (twelve) hours. 14 tablet Johnie Flaming A, NP   ketorolac  (TORADOL ) 10 MG tablet Take 1 tablet (10 mg total) by mouth every 6 (six) hours as needed for moderate pain (pain score 4-6) or severe pain (pain score 7-10). 20 tablet Johnie Flaming A, NP      PDMP not reviewed this encounter.   Johnie Flaming A, NP 04/11/24 [phone]

## 2024-04-11 NOTE — ED Notes (Signed)
 Pt adds that she would like to be tested for STDs. Reports having some vaginal discharge.

## 2024-04-12 LAB — CERVICOVAGINAL ANCILLARY ONLY
Bacterial Vaginitis (gardnerella): POSITIVE — AB
Candida Glabrata: NEGATIVE
Candida Vaginitis: POSITIVE — AB
Chlamydia: NEGATIVE
Comment: NEGATIVE
Comment: NEGATIVE
Comment: NEGATIVE
Comment: NEGATIVE
Comment: NEGATIVE
Comment: NORMAL
Neisseria Gonorrhea: NEGATIVE
Trichomonas: NEGATIVE

## 2024-04-12 LAB — RPR: RPR Ser Ql: NONREACTIVE

## 2024-04-15 ENCOUNTER — Ambulatory Visit (HOSPITAL_COMMUNITY): Payer: Self-pay

## 2024-04-15 MED ORDER — METRONIDAZOLE 500 MG PO TABS: 500.0000 mg | ORAL_TABLET | Freq: Two times a day (BID) | ORAL | 0 refills | Status: DC

## 2024-04-15 MED ORDER — FLUCONAZOLE 150 MG PO TABS: 150.0000 mg | ORAL_TABLET | Freq: Once | ORAL | 0 refills | Status: AC

## 2024-05-17 ENCOUNTER — Ambulatory Visit: Admitting: Obstetrics and Gynecology

## 2024-06-10 ENCOUNTER — Emergency Department (HOSPITAL_COMMUNITY)

## 2024-06-10 ENCOUNTER — Emergency Department (HOSPITAL_COMMUNITY)
Admission: EM | Admit: 2024-06-10 | Discharge: 2024-06-10 | Disposition: A | Discharge status: Home / Self Care | Attending: Emergency Medicine | Admitting: Emergency Medicine

## 2024-06-10 ENCOUNTER — Encounter (HOSPITAL_COMMUNITY): Payer: Self-pay

## 2024-06-10 ENCOUNTER — Other Ambulatory Visit: Payer: Self-pay

## 2024-06-10 DIAGNOSIS — N838 Other noninflammatory disorders of ovary, fallopian tube and broad ligament: Secondary | ICD-10-CM | POA: Diagnosis not present

## 2024-06-10 DIAGNOSIS — J45909 Unspecified asthma, uncomplicated: Secondary | ICD-10-CM | POA: Diagnosis not present

## 2024-06-10 DIAGNOSIS — R1031 Right lower quadrant pain: Secondary | ICD-10-CM | POA: Diagnosis present

## 2024-06-10 DIAGNOSIS — Z7251 High risk heterosexual behavior: Secondary | ICD-10-CM

## 2024-06-10 DIAGNOSIS — N83201 Unspecified ovarian cyst, right side: Secondary | ICD-10-CM | POA: Diagnosis not present

## 2024-06-10 DIAGNOSIS — K760 Fatty (change of) liver, not elsewhere classified: Secondary | ICD-10-CM | POA: Diagnosis not present

## 2024-06-10 DIAGNOSIS — N3289 Other specified disorders of bladder: Secondary | ICD-10-CM | POA: Diagnosis not present

## 2024-06-10 DIAGNOSIS — N83202 Unspecified ovarian cyst, left side: Secondary | ICD-10-CM | POA: Insufficient documentation

## 2024-06-10 DIAGNOSIS — R102 Pelvic and perineal pain unspecified side: Secondary | ICD-10-CM | POA: Diagnosis not present

## 2024-06-10 DIAGNOSIS — D649 Anemia, unspecified: Secondary | ICD-10-CM | POA: Insufficient documentation

## 2024-06-10 DIAGNOSIS — R109 Unspecified abdominal pain: Secondary | ICD-10-CM | POA: Diagnosis not present

## 2024-06-10 LAB — URINALYSIS, ROUTINE W REFLEX MICROSCOPIC
Bilirubin Urine: NEGATIVE
Glucose, UA: NEGATIVE mg/dL
Hgb urine dipstick: NEGATIVE
Ketones, ur: NEGATIVE mg/dL
Leukocytes,Ua: NEGATIVE
Nitrite: NEGATIVE
Protein, ur: NEGATIVE mg/dL
Specific Gravity, Urine: 1.027 (ref 1.005–1.030)
pH: 5 (ref 5.0–8.0)

## 2024-06-10 LAB — GC/CHLAMYDIA PROBE AMP (~~LOC~~) NOT AT ARMC
Chlamydia: POSITIVE — AB
Comment: NEGATIVE
Comment: NORMAL
Neisseria Gonorrhea: NEGATIVE

## 2024-06-10 LAB — CBC WITH DIFFERENTIAL/PLATELET
Abs Immature Granulocytes: 0.02 K/uL (ref 0.00–0.07)
Basophils Absolute: 0 K/uL (ref 0.0–0.1)
Basophils Relative: 0 %
Eosinophils Absolute: 0.2 K/uL (ref 0.0–0.5)
Eosinophils Relative: 2 %
HCT: 28.5 % — ABNORMAL LOW (ref 36.0–46.0)
Hemoglobin: 8.2 g/dL — ABNORMAL LOW (ref 12.0–15.0)
Immature Granulocytes: 0 %
Lymphocytes Relative: 24 %
Lymphs Abs: 1.9 K/uL (ref 0.7–4.0)
MCH: 19.5 pg — ABNORMAL LOW (ref 26.0–34.0)
MCHC: 28.8 g/dL — ABNORMAL LOW (ref 30.0–36.0)
MCV: 67.7 fL — ABNORMAL LOW (ref 80.0–100.0)
Monocytes Absolute: 0.5 K/uL (ref 0.1–1.0)
Monocytes Relative: 6 %
Neutro Abs: 5.4 K/uL (ref 1.7–7.7)
Neutrophils Relative %: 68 %
Platelets: 292 K/uL (ref 150–400)
RBC: 4.21 MIL/uL (ref 3.87–5.11)
RDW: 22.7 % — ABNORMAL HIGH (ref 11.5–15.5)
WBC: 8 K/uL (ref 4.0–10.5)
nRBC: 0 % (ref 0.0–0.2)

## 2024-06-10 LAB — COMPREHENSIVE METABOLIC PANEL WITH GFR
ALT: 8 U/L (ref 0–44)
AST: 17 U/L (ref 15–41)
Albumin: 4.2 g/dL (ref 3.5–5.0)
Alkaline Phosphatase: 73 U/L (ref 38–126)
Anion gap: 11 (ref 5–15)
BUN: 11 mg/dL (ref 6–20)
CO2: 25 mmol/L (ref 22–32)
Calcium: 9.3 mg/dL (ref 8.9–10.3)
Chloride: 104 mmol/L (ref 98–111)
Creatinine, Ser: 0.76 mg/dL (ref 0.44–1.00)
GFR, Estimated: >60 mL/min (ref >60)
Glucose, Bld: 115 mg/dL — ABNORMAL HIGH (ref 70–99)
Potassium: 3.5 mmol/L (ref 3.5–5.1)
Sodium: 140 mmol/L (ref 135–145)
Total Bilirubin: 0.3 mg/dL (ref 0.0–1.2)
Total Protein: 7.7 g/dL (ref 6.5–8.1)

## 2024-06-10 LAB — HCG, SERUM, QUALITATIVE: Preg, Serum: NEGATIVE

## 2024-06-10 LAB — LIPASE, BLOOD: Lipase: 38 U/L (ref 11–51)

## 2024-06-10 LAB — WET PREP, GENITAL
Sperm: NONE SEEN
Trich, Wet Prep: NONE SEEN
WBC, Wet Prep HPF POC: <10 (ref <10)
Yeast Wet Prep HPF POC: NONE SEEN

## 2024-06-10 MED ORDER — DOXYCYCLINE HYCLATE 100 MG PO CAPS: 100.0000 mg | ORAL_CAPSULE | Freq: Two times a day (BID) | ORAL | 0 refills | Status: AC

## 2024-06-10 MED ORDER — CEFTRIAXONE SODIUM 1 G IJ SOLR
500.0000 mg | Freq: Once | INTRAMUSCULAR | Status: AC
  Administered 2024-06-10: 500 mg via INTRAMUSCULAR
  Filled 2024-06-10: qty 10

## 2024-06-10 MED ORDER — NAPROXEN 500 MG PO TABS: 500.0000 mg | ORAL_TABLET | Freq: Two times a day (BID) | ORAL | 0 refills | Status: AC | PRN

## 2024-06-10 MED ORDER — LIDOCAINE HCL 1 % IJ SOLN
INTRAMUSCULAR | Status: AC
  Administered 2024-06-10: 2 mL
  Filled 2024-06-10: qty 20

## 2024-06-10 MED ORDER — IOHEXOL 300 MG/ML  SOLN
100.0000 mL | Freq: Once | INTRAMUSCULAR | Status: AC | PRN
  Administered 2024-06-10: 100 mL via INTRAVENOUS

## 2024-06-10 MED ORDER — KETOROLAC TROMETHAMINE 15 MG/ML IJ SOLN
15.0000 mg | Freq: Once | INTRAMUSCULAR | Status: AC
  Administered 2024-06-10: 15 mg via INTRAVENOUS
  Filled 2024-06-10: qty 1

## 2024-06-10 NOTE — ED Triage Notes (Addendum)
 Pt BIB EMS from home with reports of RUQ and RLQ abdominal pain x 15 hrs. Pt states that the pain is sharp and stabbing.

## 2024-06-10 NOTE — ED Provider Notes (Signed)
 [place] EMERGENCY DEPARTMENT AT [facility] Provider Note   CSN: [id number] Arrival date & time: 06/10/24  0005     Patient presents with: Abdominal Pain   Raven Medina is a 23 y.o. female.   23 year old female presents to the emergency department for evaluation of abdominal pain.  Reports some suprapubic abdominal cramping which later shifted to her right lower and upper abdomen.  This has been persistent and is aggravated by moving.  She describes the pain as sharp, stabbing.  The patient has been a bit constipated lately.  She had a more formed bowel movement yesterday.  No associated fevers, vomiting, dysuria, hematuria, vaginal bleeding or discharge.  Abdominal surgical history significant for umbilical hernia repair as a child. LMP 05/27/24.  The history is provided by the patient. No language interpreter was used.  Abdominal Pain      Prior to Admission medications   Medication Sig Start Date End Date Taking? Authorizing Provider  doxycycline  (VIBRAMYCIN ) 100 MG capsule Take 1 capsule (100 mg total) by mouth 2 (two) times daily. 06/10/24  Yes Keith Sor, PA-C  naproxen  (NAPROSYN ) 500 MG tablet Take 1 tablet (500 mg total) by mouth every 12 (twelve) hours as needed for mild pain (pain score 1-3) or moderate pain (pain score 4-6). 06/10/24  Yes Keith Sor, PA-C  Blood Pressure Monitoring (BLOOD PRESSURE KIT) DEVI 1 Device by Does not apply route once a week. 03/28/23   Erik Kieth BROCKS, MD  Prenatal Vit-Fe Fumarate-FA (PRENATAL MULTIVITAMIN) TABS tablet Take 1 tablet by mouth daily at 12 noon.    [provider]    Allergies: Patient has no known allergies.    Review of Systems  Gastrointestinal:  Positive for abdominal pain.  Ten systems reviewed and are negative for acute change, except as noted in the HPI.    Updated Vital Signs BP 110/70   Pulse 71   Temp 97.7 F (36.5 C) (Oral)   Resp 17   SpO2 100%   Physical Exam Vitals and  nursing note reviewed.  Constitutional:      General: She is not in acute distress.    Appearance: She is well-developed. She is not diaphoretic.     Comments: Nontoxic-appearing and in no acute distress  HENT:     Head: Normocephalic and atraumatic.  Eyes:     General: No scleral icterus.    Conjunctiva/sclera: Conjunctivae normal.  Cardiovascular:     Rate and Rhythm: Normal rate and regular rhythm.     Pulses: Normal pulses.  Pulmonary:     Effort: Pulmonary effort is normal. No respiratory distress.     Comments: Respirations even and unlabored Abdominal:     Palpations: Abdomen is soft. There is no mass.     Tenderness: There is no guarding.     Comments: Tenderness to palpation in the right lower quadrant as well as the right upper quadrant.  There is no involuntary guarding, palpable masses.  No discrete tenderness at McBurney's point.  Negative Murphy sign.  No peritoneal signs on exam.  Musculoskeletal:        General: Normal range of motion.     Cervical back: Normal range of motion.  Skin:    General: Skin is warm and dry.     Coloration: Skin is not pale.     Findings: No erythema or rash.  Neurological:     Mental Status: She is alert and oriented to person, place, and time.  Psychiatric:  Behavior: Behavior normal.     (all labs ordered are listed, but only abnormal results are displayed) Labs Reviewed  WET PREP, GENITAL - Abnormal; Notable for the following components:      Result Value   Clue Cells Wet Prep HPF POC PRESENT (*)    All other components within normal limits  COMPREHENSIVE METABOLIC PANEL WITH GFR - Abnormal; Notable for the following components:   Glucose, Bld 115 (*)    All other components within normal limits  CBC WITH DIFFERENTIAL/PLATELET - Abnormal; Notable for the following components:   Hemoglobin 8.2 (*)    HCT 28.5 (*)    MCV 67.7 (*)    MCH 19.5 (*)    MCHC 28.8 (*)    RDW 22.7 (*)    All other components within normal  limits  LIPASE, BLOOD  URINALYSIS, ROUTINE W REFLEX MICROSCOPIC  HCG, SERUM, QUALITATIVE  GC/CHLAMYDIA PROBE AMP ([place]) NOT AT [facility]    EKG: None  Radiology: US  PELVIC COMPLETE W TRANSVAGINAL AND TORSION R/O Result Date: 06/10/2024 EXAM: US  Pelvis, Complete Transvaginal and Transabdominal with Doppler 06/10/2024 03:52:39 AM TECHNIQUE: Transabdominal and transvaginal pelvic duplex ultrasound using B-mode/gray scaled imaging with Doppler spectral analysis and color flow was obtained. COMPARISON: CT with contrast from today. CLINICAL HISTORY: Pelvic pain, with a possible right-sided hydrosalpinx on CT today. FINDINGS: UTERUS: Uterus measures 10.6 x 4.3 x 6.0 cm. Uterus demonstrates normal myometrial echotexture. The cervix is unremarkable. No mass is seen in the uterine wall. ENDOMETRIAL STRIPE: Endometrial stripe measures 0.6 cm. Endometrial stripe is within normal limits. RIGHT OVARY: Right ovary measures 5.7 x 3.2 x 5.8 cm. There is a complex cystic lesion within the right ovary measuring 5.6 x 3.2 x 3.7 cm with echogenic mural nodular components, some of which may show trace color flow. There is normal arterial and venous Doppler flow. LEFT OVARY: Left ovary measures 5.1 x 3.9 x 4.3 cm. There is a complex cystic lesion in the left ovary measuring 3.6 x 3.0 x 3.2 cm with echogenic mural nodular components, some of which may show trace color flow. There is normal arterial and venous Doppler flow. FREE FLUID: There is a small volume of anechoic pelvic cul-de-sac fluid. No complex fluid is seen to suspect free hemorrhage. No paraovarian mass is seen. No hydrosalpinx is seen. IMPRESSION: 1. Complex bilateral ovarian cystic lesions with mural nodularity and equivocal trace flow, suspicious for neoplasm although a benign etiology is possible ; recommend gynecology consult and 6-week ultrasound follow-up to assess for resolution or for recharacterization, nonemergent MRI pelvis without and with contrast  if clinically warranted . 2. No hydrosalpinx identified. Electronically signed by: Francis Quam MD 06/10/2024 04:27 AM EST RP Workstation: [id number]   CT ABDOMEN PELVIS W CONTRAST Result Date: 06/10/2024 EXAM: CT ABDOMEN AND PELVIS WITH CONTRAST 06/10/2024 02:08:04 AM TECHNIQUE: CT of the abdomen and pelvis was performed with the administration of 100 mL of iohexol  (OMNIPAQUE ) 300 MG/ML solution. Multiplanar reformatted images are provided for review. Automated exposure control, iterative reconstruction, and/or weight-based adjustment of the mA/kV was utilized to reduce the radiation dose to as low as reasonably achievable. COMPARISON: None available. CLINICAL HISTORY: Abdominal pain, acute, nonlocalized. FINDINGS: LOWER CHEST: There are irregular opacities in the posterior basal lungs, left greater than right, which could represent atelectasis or pneumonia. The lung bases are otherwise clear. The cardiac size is normal. LIVER: There is a 2.5 x 2.2 cm low-density lesion in segment 7 of the liver with nodular enhancement at  its posterior inferior aspect. This is probably a cavernous hemangioma but there were no delayed images to confirm this. Liver-dedicated MRI is recommended for further evaluation. Elsewhere, in the dome of segment 8 there is a 1.4 cm focal hyperenhancement which is probably a flash-filling hemangioma. Attention on MRI follow-up recommended. This is on series 2 axial 10. The remainder of the liver is homogeneous with mild steatosis and measures 18 cm in length. GALLBLADDER AND BILE DUCTS: Gallbladder is unremarkable. No biliary ductal dilatation. SPLEEN: No acute abnormality. PANCREAS: No acute abnormality. ADRENAL GLANDS: No adrenal mass. KIDNEYS, URETERS AND BLADDER: No renal mass enhancement. There is a circumaortic left renal vein. No stones in the kidneys or ureters. No hydronephrosis. No perinephric or periureteral stranding. The bladder wall appears thickened but is also not fully  distended. Correlate clinically for cystitis versus underdistention. GI AND BOWEL: There are mildly thickened folds in the stomach and jejunum. Mild small bowel dilatation noted upper to mid abdomen up to 3 cm without a visible transition, favor ileus over obstruction with low-grade obstruction not excluded. The appendix is normal. There is fluid in the ascending colon. No evidence of colitis or diverticulitis. PERITONEUM AND RETROPERITONEUM: There is mild posterior pelvic free fluid, nonspecific but may be seen normally in this age group. No free air seen in the abdomen and pelvis. VASCULATURE: Aorta is normal in caliber. LYMPH NODES: No lymphadenopathy. REPRODUCTIVE ORGANS: 2.7 cm dominant follicle of the left ovary. The right ovary is not well seen due to overlapping structures. Serpiginous fluid filled tubular structure is noted just above and to the right of the uterus, 1.3 cm in diameter and could represent a hydrosalpinx, or pyosalpinx in the proper clinical setting. This could not be definitively attributed to fluid-filled bowel. The uterus is normal in size. BONES AND SOFT TISSUES: No acute osseous abnormality. In the mons pubis to the right, there is a subcutaneous lesion measuring 1.6 x 1.2 cm with rim enhancement and internal density of 34 HU which is above the fluid range. This is nonspecific and could be a solid nodule or a sebaceous cyst. IMPRESSION: 1. Thickened folds with Mild small bowel dilatation favoring ileus over obstruction; low-grade obstruction not excluded. 2. Serpiginous fluid-filled tubular structure above and to the right of the uterus, possibly hydrosalpinx or pyosalpinx; recommend gynecologic evaluation and pelvic ultrasound for further assessment. 3. Indeterminate 2.5 x 2.2 cm segment 7 hepatic lesion with nodular enhancement; recommend liver-dedicated MRI for characterization. 4. Indeterminate 1.4 cm hyperenhancing focus in pulse segment 8; recommend attention on liver-dedicated MRI  for characterization. 5. Mild pelvic free fluid. 6. Mild hepatic steatosis. Electronically signed by: Francis Quam MD 06/10/2024 02:47 AM EST RP Workstation: [id number]     Procedures   Medications Ordered in the ED  iohexol  (OMNIPAQUE ) 300 MG/ML solution 100 mL (100 mLs Intravenous Contrast Given 06/10/24 0208)  ketorolac  (TORADOL ) 15 MG/ML injection 15 mg (15 mg Intravenous Given 06/10/24 0307)  cefTRIAXone  (ROCEPHIN ) injection 500 mg (500 mg Intramuscular Given 06/10/24 0509)  lidocaine  (XYLOCAINE ) 1 % (with pres) injection (2 mLs  Given 06/10/24 0512)    Clinical Course as of 06/10/24 0552  Mon Jun 10, 2024  0229 Hemoglobin(!): 8.2 Hemoglobin of 8.2 stable compared to 1 year ago. [KH]  0302 CT shows fluid-filled tubular structure above into the right of the uterus, favoring hydrosalpinx or pyosalpinx.  Will proceed with GC/chlamydia testing as well as wet prep in addition to pelvic ultrasound to assess for TOA.  There is also some  mild small bowel dilatation and thickened folds.  Suspect that this is related to ileus as patient has no clinical concern for obstruction.  Last bowel movement was yesterday.  She has not had any vomiting. [KH]  [phone] Spoke with Dr. Zina of OBGYN regarding CT imaging and pelvic ultrasound findings; verbally communicated imaging impression. He believes that neoplasm would be less likely, given age. Agrees with swabs for GC/Chlamydia. As a precaution, would treat with doxycycline  for 1 week. Patient appropriate to follow up in clinic. [KH]    Clinical Course User Index [KH] Keith Sor, PA-C                                 Medical Decision Making Amount and/or Complexity of Data Reviewed Labs: ordered. Decision-making details documented in ED Course. Radiology: ordered.  Risk Prescription drug management.   This patient presents to the ED for concern of abdominal pain, this involves an extensive number of treatment options, and is a complaint that  carries with it a high risk of complications and morbidity.  The differential diagnosis includes constipation vs UTI vs PID vs TOA vs ovarian cyst vs appendicitis   Co morbidities that complicate the patient evaluation  Allergy Asthma ADHD IDA   Additional history obtained:  External records from outside source obtained and reviewed including prior discharge summaries   Lab Tests:  I Ordered, and personally interpreted labs.  The pertinent results include:  Hgb 8.2 (stable), Pregnancy negative, clue cells on wet prep   Imaging Studies ordered:  I ordered imaging studies including CT abdomen pelvis and pelvic US   I independently visualized and interpreted imaging which showed CT findings consistent with ileus.  Also evidence of fluid-filled tubular structure to the right of the uterus suggestive of hydrosalpinx; however, no hydrosalpinx identified on pelvic ultrasound.  Instead, there appear to be complex bilateral ovarian cystic lesions I agree with the radiologist interpretation   Cardiac Monitoring:  The patient was maintained on a cardiac monitor.  I personally viewed and interpreted the cardiac monitored which showed an underlying rhythm of: NSR   Medicines ordered and prescription drug management:  I ordered medication including Toradol  for pain, Rocephin  for PID coverage Reevaluation of the patient after these medicines showed that the patient improved I have reviewed the patients home medicines and have made adjustments as needed   Test Considered:  RPR   Consultations Obtained:  I requested consultation with Dr. Zina of OBGYN and discussed lab and imaging findings as well as pertinent plan - they recommend: 1 week of doxycycline  and outpatient f/u in clinic.   Problem List / ED Course:  As above   Reevaluation:  After the interventions noted above, I reevaluated the patient and found that they have :improved   Dispostion:  After consideration of  the diagnostic results and the patients response to treatment, I feel that the patent would benefit from completion of a course of doxycycline .  Recommended naproxen  for pain control.  Patient encouraged to follow-up with OB/GYN for further evaluation of symptoms. Return precautions discussed and provided. Patient discharged in stable condition with no unaddressed concerns.       Final diagnoses:  Pelvic pain in female  Bilateral ovarian cysts  History of unprotected sex    ED Discharge Orders          Ordered    naproxen  (NAPROSYN ) 500 MG tablet  Every 12 hours PRN  06/10/24 0500    doxycycline  (VIBRAMYCIN ) 100 MG capsule  2 times daily        06/10/24 0500               Keith Sor, Raven Medina 06/22/24 0234    Raven Norris, MD 06/27/24 [phone]

## 2024-06-10 NOTE — Discharge Instructions (Addendum)
 Call your OB/GYN office in the morning to schedule a close follow-up visit for reassessment.  Take doxycycline as prescribed until finished.  You may use naproxen  as prescribed for management of pain.  Return to the ED for new or concerning symptoms.

## 2024-06-12 ENCOUNTER — Ambulatory Visit (HOSPITAL_COMMUNITY): Payer: Self-pay
